# Patient Record
Sex: Male | Born: 1945 | Race: White | Hispanic: No | Marital: Married | Smoking: Never smoker
Health system: Southern US, Community
[De-identification: ages and names within clinical notes are randomized; demographics above are authoritative.]

## PROBLEM LIST (undated history)

## (undated) DIAGNOSIS — I209 Angina pectoris, unspecified: Secondary | ICD-10-CM

## (undated) DIAGNOSIS — Q2112 Patent foramen ovale: Secondary | ICD-10-CM

## (undated) DIAGNOSIS — G4733 Obstructive sleep apnea (adult) (pediatric): Secondary | ICD-10-CM

## (undated) DIAGNOSIS — G459 Transient cerebral ischemic attack, unspecified: Secondary | ICD-10-CM

## (undated) DIAGNOSIS — I10 Essential (primary) hypertension: Secondary | ICD-10-CM

## (undated) DIAGNOSIS — M5136 Other intervertebral disc degeneration, lumbar region: Secondary | ICD-10-CM

## (undated) DIAGNOSIS — E785 Hyperlipidemia, unspecified: Secondary | ICD-10-CM

## (undated) DIAGNOSIS — N529 Male erectile dysfunction, unspecified: Secondary | ICD-10-CM

## (undated) DIAGNOSIS — M5126 Other intervertebral disc displacement, lumbar region: Secondary | ICD-10-CM

## (undated) DIAGNOSIS — R0600 Dyspnea, unspecified: Secondary | ICD-10-CM

## (undated) DIAGNOSIS — M5410 Radiculopathy, site unspecified: Secondary | ICD-10-CM

## (undated) DIAGNOSIS — Q211 Atrial septal defect, unspecified: Secondary | ICD-10-CM

## (undated) DIAGNOSIS — M1990 Unspecified osteoarthritis, unspecified site: Secondary | ICD-10-CM

## (undated) DIAGNOSIS — I4891 Unspecified atrial fibrillation: Secondary | ICD-10-CM

## (undated) HISTORY — DX: Atrial septal defect, unspecified: Q21.10

## (undated) HISTORY — DX: Atrial septal defect: Q21.1

## (undated) HISTORY — DX: Radiculopathy, site unspecified: M54.10

## (undated) HISTORY — PX: TONSILLECTOMY: SUR1361

## (undated) HISTORY — DX: Angina pectoris, unspecified: I20.9

## (undated) HISTORY — DX: Obstructive sleep apnea (adult) (pediatric): G47.33

## (undated) HISTORY — DX: Transient cerebral ischemic attack, unspecified: G45.9

## (undated) HISTORY — DX: Other intervertebral disc displacement, lumbar region: M51.26

## (undated) HISTORY — DX: Essential (primary) hypertension: I10

## (undated) HISTORY — DX: Hyperlipidemia, unspecified: E78.5

## (undated) HISTORY — DX: Patent foramen ovale: Q21.12

## (undated) HISTORY — DX: Other intervertebral disc degeneration, lumbar region: M51.36

## (undated) HISTORY — DX: Dyspnea, unspecified: R06.00

## (undated) HISTORY — DX: Male erectile dysfunction, unspecified: N52.9

## (undated) HISTORY — DX: Unspecified osteoarthritis, unspecified site: M19.90

## (undated) HISTORY — DX: Unspecified atrial fibrillation: I48.91

## (undated) HISTORY — PX: OTHER SURGICAL HISTORY: SHX169

---

## 2013-09-17 ENCOUNTER — Ambulatory Visit: Payer: Self-pay | Admitting: Cardiology

## 2013-09-22 ENCOUNTER — Encounter: Payer: Self-pay | Admitting: Cardiology

## 2013-09-22 ENCOUNTER — Encounter: Payer: Self-pay | Admitting: *Deleted

## 2013-09-22 DIAGNOSIS — I209 Angina pectoris, unspecified: Secondary | ICD-10-CM | POA: Insufficient documentation

## 2013-09-22 DIAGNOSIS — I10 Essential (primary) hypertension: Secondary | ICD-10-CM | POA: Insufficient documentation

## 2013-09-23 ENCOUNTER — Ambulatory Visit (INDEPENDENT_AMBULATORY_CARE_PROVIDER_SITE_OTHER): Payer: BC Managed Care – PPO | Admitting: Cardiology

## 2013-09-23 ENCOUNTER — Encounter: Payer: Self-pay | Admitting: *Deleted

## 2013-09-23 ENCOUNTER — Encounter: Payer: Self-pay | Admitting: Cardiology

## 2013-09-23 VITALS — BP 130/82 | HR 61

## 2013-09-23 DIAGNOSIS — Q211 Atrial septal defect: Secondary | ICD-10-CM | POA: Insufficient documentation

## 2013-09-23 DIAGNOSIS — E785 Hyperlipidemia, unspecified: Secondary | ICD-10-CM | POA: Insufficient documentation

## 2013-09-23 DIAGNOSIS — G459 Transient cerebral ischemic attack, unspecified: Secondary | ICD-10-CM | POA: Insufficient documentation

## 2013-09-23 DIAGNOSIS — R0989 Other specified symptoms and signs involving the circulatory and respiratory systems: Secondary | ICD-10-CM

## 2013-09-23 DIAGNOSIS — Q2112 Patent foramen ovale: Secondary | ICD-10-CM | POA: Insufficient documentation

## 2013-09-23 DIAGNOSIS — I4891 Unspecified atrial fibrillation: Secondary | ICD-10-CM | POA: Insufficient documentation

## 2013-09-23 DIAGNOSIS — R0600 Dyspnea, unspecified: Secondary | ICD-10-CM | POA: Insufficient documentation

## 2013-09-23 DIAGNOSIS — R0609 Other forms of dyspnea: Secondary | ICD-10-CM

## 2013-09-23 DIAGNOSIS — Q2111 Secundum atrial septal defect: Secondary | ICD-10-CM

## 2013-09-23 NOTE — Patient Instructions (Signed)
Your physician wants you to follow-up in: 6 MONTHS WITH DR Jens SomRENSHAW IN [place] You will receive a reminder letter in the mail two months in advance. If you don't receive a letter, please call our office to schedule the follow-up appointment.   Your physician has requested that you have an echocardiogram. Echocardiography is a painless test that uses sound waves to create images of your heart. It provides your doctor with information about the size and shape of your heart and how well your heart's chambers and valves are working. This procedure takes approximately one hour. There are no restrictions for this procedure. AT THE [place] OFFICE  PRADAXA OR XARELTO OR ELIQUIS TO REPLACE WARFARIN

## 2013-09-23 NOTE — Assessment & Plan Note (Signed)
Patient with history of atrial fibrillation.he remains in sinus rhythm. Continue beta blocker and propafenone. Long discussion concerning anticoagulation. Embolic risk factors of previous TIA, hypertension and age greater than 8165. I have recommended NOAC. 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 I have explained the higher risk of stroke with aspirin compared to these agents. He will contact us if he would like to begin NOAC in the future.

## 2013-09-23 NOTE — Assessment & Plan Note (Signed)
Blood pressure controlled. Continue present medications. 

## 2013-09-23 NOTE — Assessment & Plan Note (Signed)
Status post closure. 

## 2013-09-23 NOTE — Addendum Note (Signed)
Addended by: Kem ParkinsonBARNES, Naliyah Neth on: 09/23/2013 03:24 PM   Modules accepted: Level of Service, SmartSet

## 2013-09-23 NOTE — Assessment & Plan Note (Signed)
Schedule echocardiogram to assess LV function. 

## 2013-09-23 NOTE — Progress Notes (Signed)
HPI: 68 year old male for evaluation of atrial fibrillation. Previous care at [facility]. He has a history of atrial flutter ablation in October of 2011. He had recurrent atrial fibrillation following the procedure improved with propafenone. There is a history of TIAs. He also has a history of PFO closure with Amplatzer device. Followup transesophageal echocardiogram showed no residual shunt. Negative stress test in January of 2013. Patient recently moved to this area and presents to establish. He has noted some dyspnea on exertion but no orthopnea, PND, pedal edema, chest pain, palpitations or syncope.  Current Outpatient Prescriptions  Medication Sig Dispense Refill  . aspirin 81 MG tablet Take 81 mg by mouth daily.      Marland Kitchen atorvastatin (LIPITOR) 20 MG tablet Take 1 tablet by mouth daily.      . folic acid (FOLVITE) 1 MG tablet Take 1 tablet by mouth daily.      Marland Kitchen lisinopril (PRINIVIL,ZESTRIL) 20 MG tablet Take 1 tablet by mouth daily.      . metoprolol succinate (TOPROL-XL) 25 MG 24 hr tablet Take 25 mg by mouth daily.      . Omega 3 1000 MG CAPS Take 1 capsule by mouth daily.      . propafenone (RYTHMOL SR) 325 MG 12 hr capsule Take 1 capsule by mouth daily.      . vitamin C (ASCORBIC ACID) 500 MG tablet Take 500 mg by mouth daily.       No current facility-administered medications for this visit.    Not on File  Past Medical History  Diagnosis Date  . Hyperlipidemia   . HTN (hypertension)   . PFO (patent foramen ovale)     S/P closure  . TIA (transient ischemic attack)   . Atrial flutter   . Atrial fibrillation     Past Surgical History  Procedure Laterality Date  . Arthroscopic knee surgery    . Tonsillectomy      History   Social History  . Marital Status: Married    Spouse Name: N/A    Number of Children: 2  . Years of Education: N/A   Occupational History  .      Control and instrumentation engineer   Social History Main Topics  . Smoking status: Never  Smoker   . Smokeless tobacco: Not on file  . Alcohol Use: Yes     Comment: 2 glasses wine per night  . Drug Use: Not on file  . Sexual Activity: Not on file   Other Topics Concern  . Not on file   Social History Narrative  . No narrative on file    Family History  Problem Relation Age of Onset  . Stroke Father   . Heart disease Father     Pacemaker, MI  . Diabetes Sister     ROS: no fevers or chills, productive cough, hemoptysis, dysphasia, odynophagia, melena, hematochezia, dysuria, hematuria, rash, seizure activity, orthopnea, PND, pedal edema, claudication. Remaining systems are negative.  Physical Exam:   Blood pressure 130/82, pulse 61.  General:  Well developed/well nourished in NAD Skin warm/dry Patient not depressed No peripheral clubbing Back-normal HEENT-normal/normal eyelids Neck supple/normal carotid upstroke bilaterally; no bruits; no JVD; no thyromegaly chest - CTA/ normal expansion CV - RRR/normal S1 and S2; no murmurs, rubs or gallops;  PMI nondisplaced Abdomen -NT/ND, no HSM, no mass, + bowel sounds, no bruit 2+ femoral pulses, no bruits Ext-no edema, chords, 2+ DP Neuro-grossly nonfocal  ECG Sinus rhythm, first degree  AV block; no ST changes.

## 2013-09-23 NOTE — Progress Notes (Signed)
  This encounter was created in error - please disregard.  THIS ABSTRACTION IS A ERRONEOUS ENCOUNTER PLEASE DISREGARD PLEASE SEE OTHER ABSTRACTION FOR INFO

## 2013-09-24 ENCOUNTER — Telehealth: Payer: Self-pay | Admitting: Cardiology

## 2013-09-24 MED ORDER — PROPAFENONE HCL ER 325 MG PO CP12: 325.0000 mg | ORAL_CAPSULE | Freq: Two times a day (BID) | ORAL | Status: DC

## 2013-09-24 MED ORDER — FOLIC ACID 1 MG PO TABS: 1.0000 mg | ORAL_TABLET | Freq: Two times a day (BID) | ORAL | Status: DC

## 2013-09-24 NOTE — Telephone Encounter (Signed)
Pt stated his meds were entered wrong. Adjustments were made to rythmol and folic acid.

## 2013-09-24 NOTE — Telephone Encounter (Signed)
New problem   Pt has questions about his medications. He stating it is wrong. Please call pt

## 2013-10-14 ENCOUNTER — Other Ambulatory Visit (HOSPITAL_COMMUNITY): Payer: Medicare Other

## 2013-10-15 ENCOUNTER — Encounter: Payer: Self-pay | Admitting: Internal Medicine

## 2013-10-15 ENCOUNTER — Ambulatory Visit (HOSPITAL_COMMUNITY): Payer: BC Managed Care – PPO | Attending: Internal Medicine | Admitting: Radiology

## 2013-10-15 ENCOUNTER — Other Ambulatory Visit: Payer: Self-pay

## 2013-10-15 DIAGNOSIS — Z8673 Personal history of transient ischemic attack (TIA), and cerebral infarction without residual deficits: Secondary | ICD-10-CM | POA: Insufficient documentation

## 2013-10-15 DIAGNOSIS — I4891 Unspecified atrial fibrillation: Secondary | ICD-10-CM | POA: Insufficient documentation

## 2013-10-15 DIAGNOSIS — Z9889 Other specified postprocedural states: Secondary | ICD-10-CM | POA: Insufficient documentation

## 2013-10-15 DIAGNOSIS — I10 Essential (primary) hypertension: Secondary | ICD-10-CM | POA: Insufficient documentation

## 2013-10-15 DIAGNOSIS — R0600 Dyspnea, unspecified: Secondary | ICD-10-CM

## 2013-10-15 DIAGNOSIS — E785 Hyperlipidemia, unspecified: Secondary | ICD-10-CM | POA: Insufficient documentation

## 2013-10-15 DIAGNOSIS — I4892 Unspecified atrial flutter: Secondary | ICD-10-CM | POA: Insufficient documentation

## 2013-10-15 DIAGNOSIS — Q211 Atrial septal defect: Secondary | ICD-10-CM

## 2013-10-15 DIAGNOSIS — Q2112 Patent foramen ovale: Secondary | ICD-10-CM

## 2013-10-15 DIAGNOSIS — R0602 Shortness of breath: Secondary | ICD-10-CM

## 2013-10-15 DIAGNOSIS — I079 Rheumatic tricuspid valve disease, unspecified: Secondary | ICD-10-CM | POA: Insufficient documentation

## 2013-10-15 NOTE — Progress Notes (Signed)
Echocardiogram performed.  

## 2013-10-16 ENCOUNTER — Telehealth: Payer: Self-pay | Admitting: Cardiology

## 2013-10-16 MED ORDER — FOLIC ACID 1 MG PO TABS: 1.0000 mg | ORAL_TABLET | Freq: Two times a day (BID) | ORAL | Status: DC

## 2013-10-16 NOTE — Telephone Encounter (Signed)
New Message ° °Pt called for ECHO results °

## 2013-10-16 NOTE — Telephone Encounter (Signed)
Pt is aware of echo results He also needed a refill on his folic acid. Sent in to [facility] [place] Debbie [facility] & Coray RN

## 2013-11-18 ENCOUNTER — Other Ambulatory Visit: Payer: Self-pay | Admitting: *Deleted

## 2013-11-18 MED ORDER — ATORVASTATIN CALCIUM 20 MG PO TABS: 20.0000 mg | ORAL_TABLET | Freq: Every day | ORAL | Status: DC

## 2013-11-18 MED ORDER — METOPROLOL SUCCINATE ER 25 MG PO TB24: 25.0000 mg | ORAL_TABLET | Freq: Every day | ORAL | Status: DC

## 2013-11-18 MED ORDER — LISINOPRIL 20 MG PO TABS: 20.0000 mg | ORAL_TABLET | Freq: Every day | ORAL | Status: DC

## 2013-11-18 MED ORDER — PROPAFENONE HCL ER 325 MG PO CP12: 325.0000 mg | ORAL_CAPSULE | Freq: Two times a day (BID) | ORAL | Status: DC

## 2014-01-14 ENCOUNTER — Other Ambulatory Visit: Payer: Self-pay

## 2014-01-14 MED ORDER — PROPAFENONE HCL ER 325 MG PO CP12: 325.0000 mg | ORAL_CAPSULE | Freq: Two times a day (BID) | ORAL | Status: DC

## 2014-06-02 ENCOUNTER — Encounter: Payer: Self-pay | Admitting: Cardiology

## 2014-06-02 ENCOUNTER — Ambulatory Visit (INDEPENDENT_AMBULATORY_CARE_PROVIDER_SITE_OTHER): Payer: BC Managed Care – PPO | Admitting: Cardiology

## 2014-06-02 VITALS — BP 122/80 | HR 83 | Ht 68.0 in | Wt 215.1 lb

## 2014-06-02 DIAGNOSIS — I480 Paroxysmal atrial fibrillation: Secondary | ICD-10-CM

## 2014-06-02 DIAGNOSIS — G458 Other transient cerebral ischemic attacks and related syndromes: Secondary | ICD-10-CM

## 2014-06-02 DIAGNOSIS — E785 Hyperlipidemia, unspecified: Secondary | ICD-10-CM

## 2014-06-02 DIAGNOSIS — I4891 Unspecified atrial fibrillation: Secondary | ICD-10-CM

## 2014-06-02 DIAGNOSIS — I10 Essential (primary) hypertension: Secondary | ICD-10-CM

## 2014-06-02 NOTE — Progress Notes (Signed)
HPI: FU atrial fibrillation. Previous care at [facility]. He has a history of atrial flutter ablation in October of 2011. He had recurrent atrial fibrillation following the procedure improved with propafenone. 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 He also has a history of PFO closure with Amplatzer device. Followup transesophageal echocardiogram showed no residual shunt. Negative stress test in January of 2013. Echocardiogram repeated in February 2015. This revealed normal LV function, grade 1 diastolic dysfunction, trace aortic insufficiency. There was mild tricuspid regurgitation.Since last seen, the patient has dyspnea with more extreme activities but not with routine activities. It is relieved with rest. It is not associated with chest pain. There is no orthopnea, PND or pedal edema. There is no syncope or palpitations. There is no exertional chest pain.    Current Outpatient Prescriptions  Medication Sig Dispense Refill  . aspirin 81 MG tablet Take 81 mg by mouth daily.      Marland Kitchen atorvastatin (LIPITOR) 20 MG tablet Take 1 tablet (20 mg total) by mouth daily.  90 tablet  3  . folic acid (FOLVITE) 1 MG tablet Take 1 tablet (1 mg total) by mouth 2 (two) times daily.  180 tablet  3  . lisinopril (PRINIVIL,ZESTRIL) 20 MG tablet Take 1 tablet (20 mg total) by mouth daily.  90 tablet  4  . metoprolol succinate (TOPROL-XL) 25 MG 24 hr tablet Take 1 tablet (25 mg total) by mouth daily.  90 tablet  4  . Omega 3 1000 MG CAPS Take 1 capsule by mouth 2 (two) times daily.       . propafenone (RYTHMOL SR) 325 MG 12 hr capsule Take 1 capsule (325 mg total) by mouth 2 (two) times daily.  180 capsule  2  . vitamin C (ASCORBIC ACID) 500 MG tablet Take 500 mg by mouth daily.       No current facility-administered medications for this visit.     Past Medical History  Diagnosis Date  . Hyperlipidemia   . HTN (hypertension)   . Angina pectoris   . Atrial  fibrillation   . Patent foramen ovale   . TIA (transient ischemic attack)   . Dyspnea     Past Surgical History  Procedure Laterality Date  . Arthroscopic knee surgery    . Tonsillectomy      History   Social History  . Marital Status: Married    Spouse Name: N/A    Number of Children: 2  . Years of Education: N/A   Occupational History  .      Control and instrumentation engineer   Social History Main Topics  . Smoking status: Never Smoker   . Smokeless tobacco: Not on file  . Alcohol Use: Yes     Comment: 2 glasses wine per night  . Drug Use: Not on file  . Sexual Activity: Not on file   Other Topics Concern  . Not on file   Social History Narrative  . No narrative on file    ROS: no fevers or chills, productive cough, hemoptysis, dysphasia, odynophagia, melena, hematochezia, dysuria, hematuria, rash, seizure activity, orthopnea, PND, pedal edema, claudication. Remaining systems are negative.  Physical Exam: Well-developed well-nourished in no acute distress.  Skin is warm and dry.  HEENT is normal.  Neck is supple.  Chest is clear to auscultation with normal expansion.  Cardiovascular exam is irregular Abdominal exam nontender or distended. No masses palpated. Extremities show no edema.  neuro grossly intact  ECG Probable course atrial fibrillation with no ST changes.

## 2014-06-02 NOTE — Patient Instructions (Signed)
Your physician wants you to follow-up in: 3 MONTHS WITH DR Jens SomRENSHAW You will receive a reminder letter in the mail two months in advance. If you don't receive a letter, please call our office to schedule the follow-up appointment.  REFERRAL TO DR Johney FrameALLRED TO DISCUSS ATRIAL FIB ABLATION.

## 2014-06-02 NOTE — Assessment & Plan Note (Signed)
Continue statin. 

## 2014-06-02 NOTE — Assessment & Plan Note (Signed)
The patient is in atrial fibrillation today. He is unaware of this and is relatively asymptomatic. He has mild dyspnea on exertion but no different compared to when I saw him previously in sinus rhythm. His rate is controlled. Continue beta blocker. Embolic risk factors of age greater than 5965, hypertension and previous TIA. I strongly recommended discontinuation of aspirin and initiation of NOAC. I explained the higher risk of stroke associated with atrial fibrillation not anticoagulated. He declined anticoagulation at this point and understands the risk. He is an avid Best boyracquetball player. He is concerned about bleeding associated with trauma while playing racquetball. He has asked to be considered for ablation. I will schedule an appointment with Dr. Johney FrameAllred. If ablation was successful he may be at lower risk of an embolic event in the future off of anticoagulation. However I explained that given history of TIA Dr. Johney FrameAllred in may never be comfortable discontinuing anticoagulation. We will arrange for evaluation and continue propafenone for now.

## 2014-06-02 NOTE — Assessment & Plan Note (Signed)
Previous TIA approximately 6 years ago felt secondary to patent foramen ovale. This was closed.

## 2014-06-02 NOTE — Assessment & Plan Note (Signed)
Blood pressure controlled. Continue present medications. 

## 2014-07-19 ENCOUNTER — Ambulatory Visit (INDEPENDENT_AMBULATORY_CARE_PROVIDER_SITE_OTHER): Payer: BC Managed Care – PPO | Admitting: Internal Medicine

## 2014-07-19 ENCOUNTER — Encounter: Payer: Self-pay | Admitting: Internal Medicine

## 2014-07-19 VITALS — BP 130/82 | HR 86 | Ht 68.0 in | Wt 214.8 lb

## 2014-07-19 DIAGNOSIS — R0683 Snoring: Secondary | ICD-10-CM

## 2014-07-19 DIAGNOSIS — Q2112 Patent foramen ovale: Secondary | ICD-10-CM

## 2014-07-19 DIAGNOSIS — I10 Essential (primary) hypertension: Secondary | ICD-10-CM

## 2014-07-19 DIAGNOSIS — Q211 Atrial septal defect: Secondary | ICD-10-CM

## 2014-07-19 DIAGNOSIS — I4819 Other persistent atrial fibrillation: Secondary | ICD-10-CM

## 2014-07-19 DIAGNOSIS — I481 Persistent atrial fibrillation: Secondary | ICD-10-CM

## 2014-07-19 NOTE — Progress Notes (Signed)
Primary Care Physician: Dennis BastABEZA,YURI, MD Primary Cardiologist:Dr. Jens Somrenshaw Primary Electrophysiologist:Dr. Sukari Grist Referring Physician:dr. Melba CoonCrenshaw   James Cobb is a 68 y.o. male with a h/o persistent atrial fibrillation who presents for consultation in the [facility].  The patient was initially [place] after presenting with symptoms of irregular heart beat. He had an ablation and developed some afib after the procedure but had returnted to sinus rhythm at time of scheduled DCCV. He remained on propafenone for years maintaining NSR but afib has become persistent since the last 6 weeks,found at routine Cardiology f/u from which pt is asymptomatic. He has relocated to this area x one year.  He also has history of PFO with closure with Amplatzer device, [place] years ago,[place], having 2-3 TIA's in one day which prompted workup. 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 He is asymptomatic, very high energy level and feels great. He would not be a ablation candidate to the prior PFO closure and concerns ability to cross the atrial  septum.  He does drink 2 glasses of wine nightly and has a snoring history, no previous sleep study.  Today, he denies symptoms of palpitations, chest pain, shortness of breath, orthopnea, PND, lower extremity edema, dizziness, presyncope, syncope, snoring, daytime somnolence, bleeding, or neurologic sequela. The patient is tolerating medications without difficulties and is otherwise without complaint today.    Atrial Fibrillation Risk Factors:  he does have symptoms or diagnosis of sleep apnea.  he does not have a history of rheumatic fever.  he does have a history of alcohol use.  he has a BMI of Body mass index is 32.67  kg/(m^2).Marland Kitchen. Filed Weights   07/19/14 1123  Weight: 214 lb 12.8 oz (97.433 kg)    LA size: 38mm   Atrial Fibrillation Management history:  Previous antiarrhythmic drugs: propafenone  Previous cardioversions: none  Previous ablations: x1 for aflutter  CHADS2VASC score: at least 4  Anticoagulation history: none   Past Medical History  Diagnosis Date  . Hyperlipidemia   . HTN (hypertension)   . Angina pectoris   . Atrial fibrillation   . Patent foramen ovale   . TIA (transient ischemic attack)     s/p ASD Percutaneous Closure  . Dyspnea   . ASD (atrial septal defect)   . 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 ED (erectile dysfunction)   . Radiculopathy   . Bulging lumbar disc    Past Surgical History  Procedure Laterality Date  . Arthroscopic knee surgery    . Tonsillectomy      Current Outpatient Prescriptions  Medication Sig Dispense Refill  . aspirin 81 MG tablet Take 81 mg by mouth daily.    Marland Kitchen. atorvastatin (LIPITOR) 20 MG tablet Take 1 tablet (20 mg total) by mouth daily. 90 tablet 3  . folic acid (FOLVITE) 1 MG tablet Take 1 tablet (1 mg total) by mouth 2 (two) times daily. 180 tablet 3  . lisinopril (PRINIVIL,ZESTRIL) 20 MG tablet Take 1 tablet (20 mg total) by mouth daily. 90 tablet 4  . metoprolol succinate (TOPROL-XL) 25 MG 24 hr tablet Take 1 tablet (25 mg total) by  mouth daily. 90 tablet 4  . Omega 3 1000 MG CAPS Take 1 capsule by mouth 2 (two) times daily.     . vitamin C (ASCORBIC ACID) 500 MG tablet Take 500 mg by mouth daily.     No current facility-administered medications for this visit.    No Known Allergies  History   Social History  . Marital Status: Married    Spouse Name: N/A    Number of Children: 2  . 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 Smoking status: Never Smoker    . Smokeless tobacco: Not on file  . Alcohol Use: Yes     Comment: 2 glasses wine per night  . Drug Use: Not on file  . Sexual Activity: Not on file   Other Topics Concern  . Not on file   Social History Narrative    Family History  Problem Relation Age of Onset  . Stroke Father   . Heart disease Father     Pacemaker, MI  . Diabetes Sister    The patient does not have a history of early familial atrial fibrillation or other arrhythmias.  ROS- All systems are reviewed and negative except as per the HPI above.  Physical Exam: Filed Vitals:   07/19/14 1123  BP: 130/82  Pulse: 86  Height: 5\' 8"  (1.727 m)  Weight: 214 lb 12.8 oz (97.433 kg)    GEN- The patient is well appearing, alert and oriented x 3 today.   Head- normocephalic, atraumatic Eyes-  Sclera clear, conjunctiva pink Ears- hearing intact Oropharynx- clear Neck- supple, no JVP Lymph- no cervical lymphadenopathy Lungs- Clear to ausculation bilaterally, normal work of breathing Heart- Iregular rate and rhythm, no murmurs, rubs or gallops, PMI not laterally displaced GI- soft, NT, ND, + BS Extremities- no clubbing, cyanosis, or edema MS- no significant deformity or atrophy Skin- no rash or lesion Psych- euthymic mood, full affect Neuro- strength and sensation are intact  EKG Afib, 86 bpm QRS 82 ms,QTc 411 ms Echo - Procedure narrative: Transthoracic echocardiography for left ventricular function evaluation. Image quality was suboptimal. - Left ventricle: The cavity size was normal. There was moderate concentric hypertrophy. Systolic function was normal. The estimated ejection fraction was in the range of 60% to 65%. Wall motion was normal; there were no regional wall motion abnormalities. Doppler parameters are consistent with abnormal left ventricular relaxation (grade 1 diastolic dysfunction). The E/e' ratio is <10, suggesting normal LV filling pressure. - Mitral valve: Mildly  thickened leaflets . Trivial regurgitation. - Right ventricle: The cavity size was normal. Wall thickness was normal. The moderator band was prominent. Systolic function was normal. RV systolic pressure: 23mm Hg (S, est). - Atrial septum: A patent foramen ovale cannot be excluded. - Tricuspid valve: Mild regurgitation. - Inferior vena cava: The vessel was normal in size; the respirophasic diameter changes were in the normal range (= 50%); findings are consistent with normal central venous pressure. - Pericardium, extracardiac: There was no pericardial effusion.  Epic records are reviewed at length today  Assessment and Plan:  1. Atrial fibrillation The patient has Asymptomatic persistent atrial fibrillation.  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 The patient is adequately rate controlled without rate control drugs.. Antiarrhythmic therapy to dates  has included propafenone which currently is not maintaining SR.  This patients CHA2DS2-VASc Score and unadjusted Ischemic Stroke Rate (% per year) is equal to 4.8 % stroke rate/year from a score of 4  Above score calculated as 1 point each if present [CHF, HTN, DM, Vascular=MI/PAD/Aortic Plaque, Age if 65-74, or Male] Above score calculated as 2 points each if present [Age > 75, or Stroke/TIA/TE]  The patients left atrial size is 38 mm.  Additional echo findings include normal EF A long discussion with the patient was had today regarding therapeutic strategies.  Extensive discussion of lifestyle modification including decrease or avoid alcohol intake was also discussed. He snores and therefore sleep apnea should be assessed.  Today we discussed AVERROES which suggests significant reduction in stroke risks with eliquis when compared to ASA.  He will discuss with FAA and then  initiate eliquis if this will not interfere with flying.  I have informed him that there is no data to suggest that ablation is an adequate alternative to anticoagulation.  The only indication for ablation is for symptomatic relief with AF.  As he is asymptomatic at this time, I do not think that there is presently a role for ablation in this patient. We did discuss LAA occlusion devices (Watchman) as a possibility in the future, however given amplatz occlusion of a prior PFO, this may not be technically feasible.  Presently, our recommendations include : Stop rhythmol (given ineffectiveness) and continue rate control long term Stop ASA and start eliquis (pt will continue to contemplate) Cut back to 2 ETOH drinks a week. Sleep study ordered.  Follow up with Dr. Jens Somrenshaw in 3 months I will see as needed going forward.

## 2014-07-19 NOTE — Progress Notes (Signed)
Primary Care Physician: Dennis BastABEZA,YURI, MD Primary Cardiologist:Dr. Jens Somrenshaw Primary Electrophysiologist:Dr. [name] Referring Physician:dr. Melba CoonCrenshaw   James Cobb is a 68 y.o. male with a h/o persistent atrial fibrillation who presents for consultation in the [facility].  The patient was initially diagnosed with atrial flutter [place] the [place] 2011 after presenting with symptoms of irregular heart beat. He had an ablation and developed some afib after the procedure but had returnted to sinus rhythm at time of scheduled DCCV. He remained on propafenone for years maintaining NSR but afib has become persistent since the last 6 weeks,found at routine Cardiology f/u from which pt is asymptomatic. He has relocated to this area x one year.  He also has history of PFO with closure with Amplatzer device, [place] ago,[place], having 2-3 TIA's in one day which prompted workup. 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 He is asymptomatic, very high energy level and feels great. He would not be a ablation candidate to the prior PFO closure and concerns ability to cross the atrial  septum.  He does drink 2 glasses of wine nightly and has a snoring history, no previous sleep study.  Today, he denies symptoms of palpitations, chest pain, shortness of breath, orthopnea, PND, lower extremity edema, dizziness, presyncope, syncope, snoring, daytime somnolence, bleeding, or neurologic sequela. The patient is tolerating medications without difficulties and is otherwise without complaint today.    Atrial Fibrillation Risk Factors:  he does have symptoms or diagnosis of sleep apnea.  he does not have a history of rheumatic fever.  he does have a history of alcohol use.  he has a BMI of Body mass index is 32.67  kg/(m^2).Marland Kitchen. Filed Weights   07/19/14 1123  Weight: 214 lb 12.8 oz (97.433 kg)    LA size: 38mm   Atrial Fibrillation Management history:  Previous antiarrhythmic drugs: propafenone  Previous cardioversions: none  Previous ablations: x1 for aflutter  CHADS2VASC score: at least 4  Anticoagulation history: none   Past Medical History  Diagnosis Date  . Hyperlipidemia   . HTN (hypertension)   . Angina pectoris   . Atrial fibrillation   . Patent foramen ovale   . TIA (transient ischemic attack)     s/p ASD Percutaneous Closure  . Dyspnea   . ASD (atrial septal defect)   . 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 ED (erectile dysfunction)   . Radiculopathy   . Bulging lumbar disc    Past Surgical History  Procedure Laterality Date  . Arthroscopic knee surgery    . Tonsillectomy      Current Outpatient Prescriptions  Medication Sig Dispense Refill  . aspirin 81 MG tablet Take 81 mg by mouth daily.    Marland Kitchen. atorvastatin (LIPITOR) 20 MG tablet Take 1 tablet (20 mg total) by mouth daily. 90 tablet 3  . folic acid (FOLVITE) 1 MG tablet Take 1 tablet (1 mg total) by mouth 2 (two) times daily. 180 tablet 3  . lisinopril (PRINIVIL,ZESTRIL) 20 MG tablet Take 1 tablet (20 mg total) by mouth daily. 90 tablet 4  . metoprolol succinate (TOPROL-XL) 25 MG 24 hr tablet Take 1 tablet (25 mg total) by  mouth daily. 90 tablet 4  . Omega 3 1000 MG CAPS Take 1 capsule by mouth 2 (two) times daily.     . vitamin C (ASCORBIC ACID) 500 MG tablet Take 500 mg by mouth daily.     No current facility-administered medications for this visit.    No Known Allergies  History   Social History  . Marital Status: Married    Spouse Name: N/A    Number of Children: 2  . 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 Smoking status: Never Smoker    . Smokeless tobacco: Not on file  . Alcohol Use: Yes     Comment: 2 glasses wine per night  . Drug Use: Not on file  . Sexual Activity: Not on file   Other Topics Concern  . Not on file   Social History Narrative    Family History  Problem Relation Age of Onset  . Stroke Father   . Heart disease Father     Pacemaker, MI  . Diabetes Sister    The patient does not have a history of early familial atrial fibrillation or other arrhythmias.  ROS- All systems are reviewed and negative except as per the HPI above.  Physical Exam: Filed Vitals:   07/19/14 1123  BP: 130/82  Pulse: 86  Height: 5\' 8"  (1.727 m)  Weight: 214 lb 12.8 oz (97.433 kg)    GEN- The patient is well appearing, alert and oriented x 3 today.   Head- normocephalic, atraumatic Eyes-  Sclera clear, conjunctiva pink Ears- hearing intact Oropharynx- clear Neck- supple, no JVP Lymph- no cervical lymphadenopathy Lungs- Clear to ausculation bilaterally, normal work of breathing Heart- Iregular rate and rhythm, no murmurs, rubs or gallops, PMI not laterally displaced GI- soft, NT, ND, + BS Extremities- no clubbing, cyanosis, or edema MS- no significant deformity or atrophy Skin- no rash or lesion Psych- euthymic mood, full affect Neuro- strength and sensation are intact  EKG Afib, 86 bpm QRS 82 ms,QTc 411 ms Echo - Procedure narrative: Transthoracic echocardiography for left ventricular function evaluation. Image quality was suboptimal. - Left ventricle: The cavity size was normal. There was moderate concentric hypertrophy. Systolic function was normal. The estimated ejection fraction was in the range of 60% to 65%. Wall motion was normal; there were no regional wall motion abnormalities. Doppler parameters are consistent with abnormal left ventricular relaxation (grade 1 diastolic dysfunction). The E/e' ratio is <10, suggesting normal LV filling pressure. - Mitral valve: Mildly  thickened leaflets . Trivial regurgitation. - Right ventricle: The cavity size was normal. Wall thickness was normal. The moderator band was prominent. Systolic function was normal. RV systolic pressure: 23mm Hg (S, est). - Atrial septum: A patent foramen ovale cannot be excluded. - Tricuspid valve: Mild regurgitation. - Inferior vena cava: The vessel was normal in size; the respirophasic diameter changes were in the normal range (= 50%); findings are consistent with normal central venous pressure. - Pericardium, extracardiac: There was no pericardial effusion.  Epic records are reviewed at length today  Assessment and Plan:  1. Atrial fibrillation The patient has Asymptomatic persistent atrial fibrillation.  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 The patient is adequately rate controlled without rate control drugs.. Antiarrhythmic therapy to dates  has included propafenone which currently is not maintaining SR.  The patients left atrial size is 38 mm.  Additional echo findings include normal EF A long discussion with the patient was had today regarding therapeutic strategies.  Extensive discussion of lifestyle modification including decrease or avoid alcohol intake was also discussed.  Presently, our recommendations include : 1.Persistent afib- The pt is asymptomatic, exercise tolerance is excellent and ablation may not be possible due to prior PFO closure.  AAD therapy was discussed but would not make pt feel any better and may feel worse due to side effects. Propafenone not  maintaining SR so will discontinue and continue rate control. Chadsvasc score of at least 4(age,htn, 2 for TIA) Pt is still reluctant to start NOAC, Eliquis 5 mg bid recommended. He will check with Aviation MD and if allowed, will start taking. If so,  stop ASA and curtail vigorous racquetball activities until can see how he will tolerate.  2.  Alcohol Use Cut back to 2 drinks a week.  3. Obstructive sleep apnea The importance of adequate treatment of sleep apnea was discussed today in order to improve our ability to maintain sinus rhythm long term.Sleep study ordered.  4. Follow up with Dr. Jens Somrenshaw in 3 months, here as needed.     Rudi Cocoonna Delphina Schum NP  Nurse Practitioner, [facility] 07/19/2014 5:07 PM

## 2014-07-19 NOTE — Patient Instructions (Signed)
Your physician recommends that you schedule a follow-up appointment in: 3 months with Dr. Jens Somrenshaw  Your physician has recommended that you have a sleep study. This test records several body functions during sleep, including: brain activity, eye movement, oxygen and carbon dioxide blood levels, heart rate and rhythm, breathing rate and rhythm, the flow of air through your mouth and nose, snoring, body muscle movements, and chest and belly movement.  Your physician has recommended you make the following change in your medication:  1) STOP Propafenone  Call Dennis BastKelly Lanier, RN back once you find out about the Eliquis  Decrease Alcohol consumption

## 2014-07-20 ENCOUNTER — Telehealth: Payer: Self-pay | Admitting: Internal Medicine

## 2014-07-20 MED ORDER — APIXABAN 5 MG PO TABS: 5.0000 mg | ORAL_TABLET | Freq: Two times a day (BID) | ORAL | Status: DC

## 2014-07-20 NOTE — Telephone Encounter (Signed)
Follow up   ° ° °Patient calling back to speak with nurse regarding medication   °

## 2014-07-20 NOTE — Telephone Encounter (Signed)
Spoke with patient patient and he is going to stop Aspirin and start Eliquis 5mg  twice daily today.

## 2014-09-14 ENCOUNTER — Telehealth: Payer: Self-pay | Admitting: Cardiology

## 2014-09-14 ENCOUNTER — Other Ambulatory Visit: Payer: Self-pay | Admitting: *Deleted

## 2014-09-14 MED ORDER — APIXABAN 5 MG PO TABS: 5.0000 mg | ORAL_TABLET | Freq: Two times a day (BID) | ORAL | Status: DC

## 2014-09-14 NOTE — Telephone Encounter (Signed)
Please asap,concerning his medicine. Pt did not give me details,he said it was critical.

## 2014-09-14 NOTE — Telephone Encounter (Signed)
Spoke with pt, he was given allopurinol and colchicine for elevated uric acid levels and was told to take eliquis 2.5 mg bid. He has done this x one month and his uric acid level is still high. They have increase the allopurinol and were very vague regarding how he should take the eliquis.

## 2014-09-14 NOTE — Telephone Encounter (Signed)
Discussed with sally earl pharm md, instructed to take the eliquis 5 mg bid. There is no need to reduce the dosage with the current medicines. Copy of dr allred's office note from 07-19-14 mailed to patient per his request.

## 2014-10-06 ENCOUNTER — Ambulatory Visit (HOSPITAL_BASED_OUTPATIENT_CLINIC_OR_DEPARTMENT_OTHER): Payer: BC Managed Care – PPO | Attending: Internal Medicine | Admitting: Radiology

## 2014-10-06 VITALS — Ht 68.0 in | Wt 200.0 lb

## 2014-10-06 DIAGNOSIS — G4719 Other hypersomnia: Secondary | ICD-10-CM

## 2014-10-06 DIAGNOSIS — G4710 Hypersomnia, unspecified: Secondary | ICD-10-CM

## 2014-10-06 DIAGNOSIS — I4819 Other persistent atrial fibrillation: Secondary | ICD-10-CM

## 2014-10-06 DIAGNOSIS — I481 Persistent atrial fibrillation: Secondary | ICD-10-CM | POA: Diagnosis not present

## 2014-10-12 ENCOUNTER — Telehealth: Payer: Self-pay | Admitting: Cardiology

## 2014-10-12 NOTE — Sleep Study (Signed)
   NAME: James Cobb DATE OF BIRTH:  11/01/1945 MEDICAL RECORD NUMBER [id number]  LOCATION: [facility]  PHYSICIAN: TURNER,[name]  DATE OF STUDY: 10/06/2014  SLEEP STUDY TYPE: Nocturnal Polysomnogram               REFERRING PHYSICIAN: Hillis RangeAllred, James, MD  INDICATION FOR STUDY: snoring, excessive daytime sleepiness  EPWORTH SLEEPINESS SCORE: 8 HEIGHT: 5\' 8"  (172.7 cm)  WEIGHT: 200 lb (90.719 kg)    Body mass index is 30.42 kg/(m^2).  NECK SIZE: 16.5 in.  MEDICATIONS: Reviewed in the chart  SLEEP ARCHITECTURE: The patient slept for a total of 249 minutes with no slow wave sleep and 30 minutes of REM sleep.  Onset to sleep latency was prolonged at 40 minutes and onset to REM sleep latency was slightly prolonged at 112 minutes.  Sleep efficiency was reduced at 59%.    RESPIRATORY DATA: There were a total of 9 apneas, of which, 4 were obstructive and 5 were central.  There were 59 obstructive hypopneas.  Most events occurred in the non supine position and during NREM sleep.  The overall AHI is 16.4 events per hour consistent with moderate Obstructive sleep apnea/hypopnea syndrome.  There was mild to moderate snoring.  OXYGEN DATA: The lowest O2 saturation during REM sleep was 80% and during NREM sleep 85%.  The total time spent with O2 saturations <88% was 4 minutes.    CARDIAC DATA: The patient maintained atrial fibrillation with PVC's.  Average HR was 99bpm with max HR 184bpm and minimum HR 35bpm.  MOVEMENT/PARASOMNIA: There were an insignificant amount of periodic limb movements noted for a PLMS index of 1.9 movements per hour.  There were no REM behavior sleep disorders noted.    IMPRESSION/ RECOMMENDATION:   1.  Moderate obstructive sleep apnea/hypopnea syndrome with an AHI of 16 events per hour.   Most events occurred during NREM sleep in the non supine position.   2.  Reduced sleep efficiency with increased frequency of arousals due to respiratory events. 3.   Oxygen desaturations associated with respiratory events with lowest O2 saturation of 80%.   4.  Reduced REM sleep. 5.  Atrial fibrillation with PVC's.  Average HR was 97bpm .  Maximum HR 184bpm and minimum HR 35bpm.   6.  The patient should be counseled in good sleep hygiene and weight loss.   7.  Give degree of sleep disordered breathing and patient's symptoms of excessive daytime sleepiness and atrial fibrillation, recommend proceeding with CPAP titration.  Signed: [name],[name] Diplomate, American Board of Sleep Medicine  ELECTRONICALLY SIGNED ON:  10/12/2014, 3:36 PM [facility] PH: [phone]   FX: [phone] ACCREDITED BY THE AMERICAN ACADEMY OF SLEEP MEDICINE

## 2014-10-12 NOTE — Telephone Encounter (Signed)
Please let patient know that he has moderate OSA and I recommend proceeding with CPAP titration.  Please set up for CPAP titration.

## 2014-10-13 NOTE — Telephone Encounter (Signed)
Left message to call back  

## 2014-10-15 NOTE — Telephone Encounter (Signed)
Patient refuses CPAP titration at this time.  Patient requesting copy of PSG study and Dr. Norris Crossurner's report. Informed patient study has not been uploaded, but both will be sent to him when available. Patient agrees with treatment plan.

## 2014-10-20 ENCOUNTER — Telehealth: Payer: Self-pay | Admitting: Cardiology

## 2014-10-20 DIAGNOSIS — I480 Paroxysmal atrial fibrillation: Secondary | ICD-10-CM

## 2014-10-20 NOTE — Telephone Encounter (Signed)
Dr Johney FrameAllred ordered this sleep study; I will forward to him to discuss with pt. I will schedule fu for atrial fibrillation as appears rate control will be therapy. Olga MillersBrian Greenley Martone

## 2014-10-20 NOTE — Telephone Encounter (Signed)
Patient asking about Sleep Study report. Informed him that it was placed in the "outgoing bin" on Monday.   Patient agitated stating he has not heard from Dr. Johney FrameAllred or Dr. Jens Somrenshaw about sleep study results.  Reminded him that Dr. Mayford Knifeurner read the sleep studiy, and he was already given results (see last phone encounter).  Pt wants to hear from [name] or Crenshaw about why he needed the sleep study in the first place and what they are going to do now. He st his heart rate is almost 100 bpm and his cardiac issues need to be addressed.   To Dr. Johney FrameAllred and Jens Somrenshaw and their nurses for follow-up.

## 2014-10-20 NOTE — Telephone Encounter (Signed)
The patient has moderate sleep apnea and it appears that Dr Mayford Knifeurner plans on CPAP trial.  Tresa EndoKelly, please inform the patient that he should be hearing from Dr Mayford Knifeurner, our sleep specialist soon.

## 2014-10-20 NOTE — Telephone Encounter (Signed)
Please let patient know that he has moderate OSA and I recommend proceeding with CPAP titration.   Please set up for CPAP titration.           Looks like patient ws informed on 10/12/14 by Dr Norris Crossurner's nurse

## 2014-10-20 NOTE — Telephone Encounter (Signed)
New Msg        Pt returning call about sleep study.     Please return call.

## 2014-10-21 NOTE — Telephone Encounter (Signed)
Is patient willing to proceed with CPAP titration - if so please let Orpha BurKaty know so she can order it.

## 2014-10-21 NOTE — Telephone Encounter (Signed)
Spoke with patient and let him know Dr Johney FrameAllred recommended that he proceed with CPAP titration.  He wants Dr Ludwig Clarksrenshaw's opinion as he is his Cardiologist.  He is most concerned with his HR of 100 and wants to know what to do about it.  I let him know I would talk to [facility] Mathis,RN Dr. Ludwig Clarksrenshaw's nurse and have them call him back.  He was very appreciative of my call but wants to hear from Dr Jens Somrenshaw. I tried to explain as Dr Johney FrameAllred ordered the test that is why we called him with the results he wants to her from his cardiologist.

## 2014-10-21 NOTE — Telephone Encounter (Signed)
Agree with CPAP titration; increase toprol to 50 mg daily; in one week 24 hr holter to assess heart rate with atrial fibrillation; then fuov James MillersBrian Crenshaw

## 2014-10-21 NOTE — Telephone Encounter (Signed)
Left message for pt to call.

## 2014-10-22 ENCOUNTER — Encounter: Payer: Self-pay | Admitting: Cardiology

## 2014-10-22 MED ORDER — ALLOPURINOL 100 MG PO TABS: 200.0000 mg | ORAL_TABLET | Freq: Every day | ORAL | Status: AC

## 2014-10-22 MED ORDER — METOPROLOL SUCCINATE ER 50 MG PO TB24: 50.0000 mg | ORAL_TABLET | Freq: Every day | ORAL | Status: DC

## 2014-10-22 NOTE — Telephone Encounter (Signed)
Spoke with pt, Aware of dr Ludwig Clarkscrenshaw's recommendations.  He agrees to the CPAP titration and I will have dr turner's nurse give him a call. Patient voiced understanding to increase the toprol, new script sent to the pharmacy. [facility] [address] location. Patient has a follow up appointment.

## 2014-10-22 NOTE — Telephone Encounter (Signed)
Returning call to Verm[facility]ra . Please call   Thanks

## 2014-10-22 NOTE — Telephone Encounter (Signed)
This encounter was created in error - please disregard.

## 2014-10-26 ENCOUNTER — Other Ambulatory Visit: Payer: Self-pay | Admitting: *Deleted

## 2014-10-26 ENCOUNTER — Telehealth: Payer: Self-pay

## 2014-10-26 DIAGNOSIS — I480 Paroxysmal atrial fibrillation: Secondary | ICD-10-CM

## 2014-10-26 DIAGNOSIS — G4730 Sleep apnea, unspecified: Secondary | ICD-10-CM

## 2014-10-26 NOTE — Telephone Encounter (Signed)
Left message to call back  

## 2014-10-26 NOTE — Telephone Encounter (Signed)
-----   Message from Deliah Goodyebra Mathis, RN sent at 10/22/2014  5:24 PM EST ----- Patient has agreed to the CPAP titration. Can you please call and discuss with him. Thanks.

## 2014-10-27 ENCOUNTER — Other Ambulatory Visit: Payer: Self-pay | Admitting: *Deleted

## 2014-10-27 MED ORDER — FOLIC ACID 1 MG PO TABS: 1.0000 mg | ORAL_TABLET | Freq: Two times a day (BID) | ORAL | Status: DC

## 2014-10-27 NOTE — Telephone Encounter (Signed)
Spoke with pt. About setting up the order for the CPAP titration. He is still okay with doing this, so I let him know that I would put in the order and someone would be calling him to schedule this. He stated verbal understanding. The order has been submitted and [facility].

## 2014-10-27 NOTE — Telephone Encounter (Signed)
Called to speak with pt to let him know I will be putting in order for CPAP Titration. His wife asked that I call him back at 11:00am

## 2014-10-27 NOTE — Telephone Encounter (Signed)
Pt was called by Mariane Masterseborah Miller to set up CPAP Titration and he told her that he did not know when he was going back to work. She tried to schedule this for May, and he said that he didn't know a date that he could do it. He told her that He did not want to do anything at the moment, that when he decided he wanted to do this he would call back to schedule it.  We have left the order in so that when he calls he can just schedule the titration.

## 2014-10-29 ENCOUNTER — Other Ambulatory Visit: Payer: Self-pay | Admitting: *Deleted

## 2014-10-29 MED ORDER — ATORVASTATIN CALCIUM 20 MG PO TABS: 20.0000 mg | ORAL_TABLET | Freq: Every day | ORAL | Status: DC

## 2014-11-03 ENCOUNTER — Ambulatory Visit: Payer: Medicare Other | Admitting: Cardiology

## 2014-11-10 ENCOUNTER — Ambulatory Visit (INDEPENDENT_AMBULATORY_CARE_PROVIDER_SITE_OTHER): Payer: BC Managed Care – PPO | Admitting: Cardiology

## 2014-11-10 ENCOUNTER — Encounter: Payer: Self-pay | Admitting: Cardiology

## 2014-11-10 VITALS — BP 116/78 | HR 100 | Ht 68.0 in | Wt 213.4 lb

## 2014-11-10 DIAGNOSIS — E785 Hyperlipidemia, unspecified: Secondary | ICD-10-CM

## 2014-11-10 DIAGNOSIS — Q211 Atrial septal defect: Secondary | ICD-10-CM

## 2014-11-10 DIAGNOSIS — I10 Essential (primary) hypertension: Secondary | ICD-10-CM | POA: Diagnosis not present

## 2014-11-10 DIAGNOSIS — G4733 Obstructive sleep apnea (adult) (pediatric): Secondary | ICD-10-CM

## 2014-11-10 DIAGNOSIS — I482 Chronic atrial fibrillation, unspecified: Secondary | ICD-10-CM

## 2014-11-10 DIAGNOSIS — Q2112 Patent foramen ovale: Secondary | ICD-10-CM

## 2014-11-10 NOTE — Assessment & Plan Note (Signed)
Continue statin. 

## 2014-11-10 NOTE — Patient Instructions (Signed)
Your physician wants you to follow-up in: 6 MONTHS WITH DR CRENSHAW You will receive a reminder letter in the mail two months in advance. If you don't receive a letter, please call our office to schedule the follow-up appointment.   Your physician recommends that you HAVE LAB WORK TODAY 

## 2014-11-10 NOTE — Assessment & Plan Note (Addendum)
Discussed the importance of CPAP. Patient will need titration. This is being managed by Dr. Mayford Knifeurner.

## 2014-11-10 NOTE — Assessment & Plan Note (Signed)
Status post closure. 

## 2014-11-10 NOTE — Progress Notes (Signed)
HPI: FU atrial fibrillation. Previous care at [facility]. He has a history of atrial flutter ablation in October of 2011. He had recurrent atrial fibrillation following the procedure. 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 He also has a history of PFO closure with Amplatzer device. Followup transesophageal echocardiogram showed no residual shunt. Negative stress test in January of 2013. Echocardiogram repeated in February 2015. This revealed normal LV function, grade 1 diastolic dysfunction, trace aortic insufficiency. There was mild tricuspid regurgitation. Patient seen by Dr. Johney Frame and rate control/anticoagulation recommended. Rythmol discontinued. Patient had a sleep study that showed moderate obstructive sleep apnea. History of Toprol was increased for improved rate control recently. Monitor ordered to make sure her rate adequately controlled but not performed. Since last seen, the patient denies any dyspnea on exertion, orthopnea, PND, pedal edema, palpitations, syncope or chest pain.   Current Outpatient Prescriptions  Medication Sig Dispense Refill  . allopurinol (ZYLOPRIM) 100 MG tablet Take 2 tablets (200 mg total) by mouth daily.    Marland Kitchen apixaban (ELIQUIS) 5 MG TABS tablet Take 1 tablet (5 mg total) by mouth 2 (two) times daily. 180 tablet 3  . atorvastatin (LIPITOR) 20 MG tablet Take 1 tablet (20 mg total) by mouth daily. 90 tablet 0  . folic acid (FOLVITE) 1 MG tablet Take 1 tablet (1 mg total) by mouth 2 (two) times daily. 180 tablet 0  . lisinopril (PRINIVIL,ZESTRIL) 20 MG tablet Take 1 tablet (20 mg total) by mouth daily. 90 tablet 4  . metoprolol succinate (TOPROL-XL) 50 MG 24 hr tablet Take 1 tablet (50 mg total) by mouth daily. 90 tablet 3   No current facility-administered medications for this visit.     Past Medical History  Diagnosis Date  . Hyperlipidemia   . HTN (hypertension)   . Angina pectoris   . Atrial  fibrillation   . Patent foramen ovale   . TIA (transient ischemic attack)     s/p ASD Percutaneous Closure  . Dyspnea   . ASD (atrial septal defect)   . 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 ED (erectile dysfunction)   . Radiculopathy   . Bulging lumbar disc     Past Surgical History  Procedure Laterality Date  . Arthroscopic knee surgery    . Tonsillectomy      History   Social History  . Marital Status: Married    Spouse Name: N/A  . Number of Children: 2  . Years of Education: N/A   Occupational History  .      Control and instrumentation engineer   Social History Main Topics  . Smoking status: Never Smoker   . Smokeless tobacco: Not on file  . Alcohol Use: Yes     Comment: 2 glasses wine per night  . Drug Use: Not on file  . Sexual Activity: Not on file   Other Topics Concern  . Not on file   Social History Narrative    ROS: no fevers or chills, productive cough, hemoptysis, dysphasia, odynophagia, melena, hematochezia, dysuria, hematuria, rash, seizure activity, orthopnea, PND, pedal edema, claudication. Remaining systems are negative.  Physical Exam: Well-developed well-nourished in no acute distress.  Skin is warm and dry.  HEENT is normal.  Neck is supple.  Chest is clear to auscultation with normal expansion.  Cardiovascular exam is irregular Abdominal exam nontender  or distended. No masses palpated. Extremities show no edema. neuro grossly intact

## 2014-11-10 NOTE — Assessment & Plan Note (Addendum)
Patient is now in permanent atrial fibrillation. Continue Toprol for rate control. CHADSvasc 4. Continue apixaban. Check hemoglobin and renal function. Check 24-hour Holter monitor to make sure that rate is controlled. 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

## 2014-11-10 NOTE — Assessment & Plan Note (Signed)
Blood pressure controlled. Continue present medications. 

## 2014-11-11 ENCOUNTER — Encounter: Payer: Self-pay | Admitting: Cardiology

## 2014-11-11 ENCOUNTER — Telehealth: Payer: Self-pay | Admitting: Cardiology

## 2014-11-11 LAB — CBC
HEMATOCRIT: 46.5 % (ref 39.0–52.0)
Hemoglobin: 15.5 g/dL (ref 13.0–17.0)
MCH: 32.4 pg (ref 26.0–34.0)
MCHC: 33.3 g/dL (ref 30.0–36.0)
MCV: 97.3 fL (ref 78.0–100.0)
MPV: 11.1 fL (ref 8.6–12.4)
PLATELETS: 264 10*3/uL (ref 150–400)
RBC: 4.78 MIL/uL (ref 4.22–5.81)
RDW: 14 % (ref 11.5–15.5)
WBC: 6 10*3/uL (ref 4.0–10.5)

## 2014-11-11 LAB — BASIC METABOLIC PANEL WITH GFR
BUN: 19 mg/dL (ref 6–23)
CHLORIDE: 105 meq/L (ref 96–112)
CO2: 30 meq/L (ref 19–32)
Calcium: 9.9 mg/dL (ref 8.4–10.5)
Creat: 1.02 mg/dL (ref 0.50–1.35)
GFR, EST AFRICAN AMERICAN: 87 mL/min
GFR, EST NON AFRICAN AMERICAN: 75 mL/min
Glucose, Bld: 91 mg/dL (ref 70–99)
Potassium: 4.4 mEq/L (ref 3.5–5.3)
Sodium: 141 mEq/L (ref 135–145)

## 2014-11-11 NOTE — Telephone Encounter (Signed)
New message         Pt would like more information about the cpap titration

## 2014-11-11 NOTE — Telephone Encounter (Signed)
Patient informed that CPAP titration will be done overnight at the sleep lab. Patient unhappy and does not understand why he has to go to the lab again. Explained the different between the sleep study and the CPAP titration and patient reluctantly agrees to keep appointment. Instructed patient to call if he has any further questions.

## 2014-11-23 ENCOUNTER — Ambulatory Visit (HOSPITAL_BASED_OUTPATIENT_CLINIC_OR_DEPARTMENT_OTHER): Payer: BC Managed Care – PPO | Attending: Cardiology | Admitting: Radiology

## 2014-11-23 VITALS — Ht 68.0 in | Wt 205.0 lb

## 2014-11-23 DIAGNOSIS — G4733 Obstructive sleep apnea (adult) (pediatric): Secondary | ICD-10-CM | POA: Diagnosis not present

## 2014-11-23 DIAGNOSIS — I4891 Unspecified atrial fibrillation: Secondary | ICD-10-CM | POA: Diagnosis not present

## 2014-11-23 DIAGNOSIS — G4710 Hypersomnia, unspecified: Secondary | ICD-10-CM | POA: Diagnosis not present

## 2014-11-23 DIAGNOSIS — G4730 Sleep apnea, unspecified: Secondary | ICD-10-CM

## 2014-11-23 DIAGNOSIS — R0683 Snoring: Secondary | ICD-10-CM | POA: Diagnosis not present

## 2014-11-24 ENCOUNTER — Encounter: Payer: Self-pay | Admitting: Cardiology

## 2014-11-24 ENCOUNTER — Telehealth: Payer: Self-pay | Admitting: Cardiology

## 2014-11-24 NOTE — Telephone Encounter (Signed)
This encounter was created in error - please disregard.

## 2014-11-24 NOTE — Telephone Encounter (Signed)
Informed patient of results and verbal understanding expressed.  OV scheduled for 6/10. [facility] notified.  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  Patient supplied his email address ([email or web address]) to give to medical records to send him a release.

## 2014-11-24 NOTE — Addendum Note (Signed)
Addended by: Armanda MagicURNER, Karlon Schlafer R on: 11/24/2014 02:19 PM   Modules accepted: Orders

## 2014-11-24 NOTE — Telephone Encounter (Signed)
Please let patient know that he had successful CPAP titration and set up OV with me in 10 weeks.  [facility]

## 2014-11-24 NOTE — Telephone Encounter (Signed)
New message      Pt had  CPAP titration.  He want the results for his job.  Please call

## 2014-11-24 NOTE — Sleep Study (Signed)
   NAME: James CrossRene Smith DATE OF BIRTH:  02/27/1946 MEDICAL RECORD NUMBER [id number]  LOCATION: [facility]  PHYSICIAN: TURNER,[name]  DATE OF STUDY: 11/23/2014  SLEEP STUDY TYPE: Positive Airway Pressure Titration               REFERRING PHYSICIAN: [name], [name], MD  INDICATION FOR STUDY: Obstructive Sleep Apnea  EPWORTH SLEEPINESS SCORE: 6 HEIGHT: 5\' 8"  (172.7 cm)  WEIGHT: 205 lb (92.987 kg)    Body mass index is 31.18 kg/(m^2).  NECK SIZE: 17 in.  MEDICATIONS: Reviewed in the chart  SLEEP ARCHITECTURE: The patient slept for a total of 214 minutes out of a total sleep period of 258 minutes.  There was no slow wave sleep and 56 minutes of REM sleep.  The onset to sleep latency was normal at 10 minutes and onset to REM sleep latency was normal at 84 minutes.  The sleep efficiency was reduced at 60%.    RESPIRATORY DATA: The patient was started on CPAP at 5cm H2O and titrated for respiratory events and snoring to 16cm H2O.  The patient was able to achieve REM sleep at optimum pressure of 16cm H2O and maintain that pressure for a prolonged period of time without any further respiratory events.  The AHI was 0.  Snoring was eliminated on CPAP.  OXYGEN DATA: The average oxygen saturation during the CPAP titration was 96%.  The lowest oxygen saturation was 91%.    CARDIAC DATA: The patient was in atrial fibrillation throughout the study.  The average heart rate was 89bpm,  The maximum heart rate was 171bpm and the minimum heart rate was 38bpm.    MOVEMENT/PARASOMNIA: There were an increased number of periodic limb movement disorders with a PLMS index of 19.3 movements per hour.  There were no REM sleep behavior disorders.    IMPRESSION/ RECOMMENDATION:   1.   Moderate obstructive sleep apnea/hypopnea syndrome with an AHI of 16 events per hour.   2.   Successful CPAP titration to 16cm H2O.  The patient was able to achieve REM sleep at optimum pressure of 16cm H2O and  maintain that pressure for a prolonged period of time without any further respiratory events.  The AHI was 0.  Snoring was eliminated on CPAP. 3.  Reduced sleep efficiency with increased frequency of spontaneous arousals.   4.  Atrial fibrillation noted during the study with average heart rate 89 bpm.   5.  Recommend ResMed CPAP with heated humidifier at 16cm H2O, statndard Resmed Mirage FX mask.  Signed: [name],[name] Diplomate, American Board of Sleep Medicine  ELECTRONICALLY SIGNED ON:  11/24/2014, 2:07 PM [facility] PH: [phone]   FX: [phone] ACCREDITED BY THE AMERICAN ACADEMY OF SLEEP MEDICINE

## 2014-11-29 ENCOUNTER — Telehealth: Payer: Self-pay | Admitting: Cardiology

## 2014-11-29 NOTE — Telephone Encounter (Signed)
He did well on this setting at the sleep lab and I would like him to try it first and see how he does

## 2014-11-29 NOTE — Telephone Encounter (Signed)
Calling stating he picked up his CPAP machine this AM.  They advised him that the setting was on "16".  He states this is too high and he knows he can't sleep with that high of a setting. States someone else was at the facility when he was there and their machine was set on automatic.  Advised would have to forward to Dr. Mayford Knifeurner to see what she advises.  Will notify him when we have a response from her.

## 2014-11-29 NOTE — Telephone Encounter (Signed)
New Message  Pt called requests a call back to discuss the pressure on his sleep apnea device/ Please call

## 2014-11-29 NOTE — Telephone Encounter (Signed)
States he will try this setting tonight but is not happy with keeping it on "16".  States he will try for tonight but knows it won't work so will be calling back tomorrow.  He states he would like the automatic setting.  Advised to try and let us know how he sleeps.

## 2014-11-29 NOTE — Telephone Encounter (Signed)
Lm to cb.

## 2014-11-30 ENCOUNTER — Telehealth: Payer: Self-pay | Admitting: Cardiology

## 2014-11-30 DIAGNOSIS — G4733 Obstructive sleep apnea (adult) (pediatric): Secondary | ICD-10-CM

## 2014-11-30 NOTE — Telephone Encounter (Signed)
Pt is frustrated. States he told the tech that conducted the sleep study that the pressure was too high later into the study which was 16/ he states he did not sleep after that.  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  He wants either to switch the machine for an automatic or reduce the settings. Pt will not wear the machine tonight.  Please advise.

## 2014-11-30 NOTE — Telephone Encounter (Signed)
New problem   Pt stated he called yesterday concerning his CPAP machine and he stated the information the nurse gave him to do didn't work last night. Pt is requesting another call back and stated for someone to please contact Dr Mayford Knifeurner for advise

## 2014-11-30 NOTE — Telephone Encounter (Signed)
I will forward to Dr Mayford Knifeurner/ and CMA that will be here tomorrow.

## 2014-12-01 ENCOUNTER — Telehealth: Payer: Self-pay | Admitting: *Deleted

## 2014-12-01 NOTE — Telephone Encounter (Signed)
I discussed with Mr. James Cobb the notes below.  I let him know that [facility] him as[facility] to set up this titration. He did not seem satisfied with anything below and wanted an exact time that he would be [facility]. He was very argumentative about everything that he was told, and said that if [facility] him early in the morning that he would not be answering because he is on a different schedule than most, and that they could talk with him on his time. He stated that he will not be wearing his mask tonight, and that if this titration is urgent then [facility] should have [facility] him today when he said he was available.  He stated that he did not like surprises and wanted to know the exact times of phone calls and what they would be telling him. I told him that I could not give him that 100% assurance because all I could do was put the order in and mark it as urgent so they would call him. He was again not satisfied with this, immediately told me goodbye and hung up.

## 2014-12-01 NOTE — Telephone Encounter (Signed)
Pt very frustrated regarding talking to Dr. Mayford Knifeurner, has been trying for 3 days and was told yesterday she would call him this am and has not gotten a call--he is requesting a call back today

## 2014-12-01 NOTE — Telephone Encounter (Signed)
Please order a 2 week auto CPAP titration from 4 to 20cm H2O through [facility] - please call Henderson NewcomerMelissa Stenson [phone] with [facility]

## 2014-12-01 NOTE — Telephone Encounter (Signed)
Spoke with Mr. James Cobb about Dr Mayford Knifeurner ordering a 2 week titration. At first he stated that he was okay with this, but then wanted me to ask Dr. Mayford Knifeurner about the automatic machine.    Per Dr. Mayford Knifeurner, the automatic machine would not work for him because of the amount of apneas that he had during his sleep study. She stated that if his oxygen level drops or if his breathing pauses, the automatic machine has to "catch up" so the amount of pressure may never be the same during the night. At some points it may be too high, and at others it might not be enough.   I have placed the order for the 2 week titration, and contacted Henderson NewcomerMelissa Stenson [facility] to let her know the order is in.     I will call Mr. James Cobb to let him know all the details above. He has requested a call back today, but only after 3pm.

## 2014-12-01 NOTE — Telephone Encounter (Signed)
Erroneous Encounter. See phone note below

## 2014-12-21 ENCOUNTER — Telehealth: Payer: Self-pay | Admitting: Cardiology

## 2014-12-21 DIAGNOSIS — G4733 Obstructive sleep apnea (adult) (pediatric): Secondary | ICD-10-CM

## 2014-12-21 NOTE — Telephone Encounter (Signed)
Per Dr. Mayford Knifeurner, patient is to decrease CPAP to 12 cm H2O and check download in two weeks.  Left message to call back.   [facility] notified and settings changed.

## 2014-12-21 NOTE — Telephone Encounter (Signed)
New Message       Pt calling stating that someone was supposed to call him to give him guidance on using C-pap machine, pt states if he doesn't get any guidance he will not be using the Cpap- machine tonight. Please call back and advise.

## 2014-12-21 NOTE — Telephone Encounter (Signed)
See instructions on CPAP download from DME

## 2014-12-21 NOTE — Telephone Encounter (Signed)
Patient irritated he has not heard back from the office about his two week auto-titration.  Patient st the 2 weeks was completed today and he has not returned his machine. Informed the patient that we have not received the results from [facility] yet because he still has the machine, but that they would be contacted to send. Patient upset that we "don't have our act together" and that "we should know his results now." Reiterated to patient that [facility] will have to send the results to us for Dr. Mayford Knifeurner to review and I would be in contact with him after. Pt continued to get more frustrated, demanding he is called back "before long" because he refuses to take the machine back if we don't call him with instructions. He also refuses to pay over $900 for the second machine (the charge if he does not bring it back).  Apologized to patient for his frustrations and told him [facility].   Patient requesting for Dr. Mayford Knifeurner to prescribe him the auto-titrating PAP so this will not be an issue any longer.  [facility] and requested auto-titration results. Received and given to Dr. Mayford Knifeurner for review.

## 2014-12-22 NOTE — Telephone Encounter (Signed)
Follow Up  Pt states that he was on an automatic machine for a study. The results would have shown which pressure to use and if it has been applied. He states that he cannot use the machine until it has been adjusted.

## 2014-12-22 NOTE — Telephone Encounter (Signed)
Pt made aware that [facility] him regarding the new machine titration. Pt stated he has already gotten a voicemail and that he returned the other machine back today.  Pt requested that once all reports are resulted by Dr. 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   Message  below from [facility]t to pt CPAP: I have spoken with Mardelle MatteAndy today. He is changing the pt's pressure to 12 today.  We have instructed him to turn in the loaner unit as well.  Thanks!

## 2014-12-23 NOTE — Telephone Encounter (Signed)
Left message for patient that I personally spoke with [facility] Tuesday and told them about the pressure change. Instructed patient to call back if there are any further questions or concerns.

## 2014-12-23 NOTE — Telephone Encounter (Signed)
Left message to call back  

## 2014-12-24 NOTE — Telephone Encounter (Signed)
Patient requesting download information. Printed and placed in out-going mail. Patient expresses gratitude for all the help.

## 2014-12-28 ENCOUNTER — Telehealth: Payer: Self-pay | Admitting: Cardiology

## 2014-12-28 NOTE — Telephone Encounter (Signed)
Explained to patient that his results were placed in the mail Friday afternoon, so it may not have been picked up until yesterday. Patient upset the office does not have outgoing mail on Saturdays.  Informed patient that he should get his paperwork soon.

## 2014-12-28 NOTE — Telephone Encounter (Signed)
New Message  Pt calling about documents being sent to him. Pt has not received anything. Please call back and discuss.

## 2015-01-02 ENCOUNTER — Encounter (HOSPITAL_BASED_OUTPATIENT_CLINIC_OR_DEPARTMENT_OTHER): Payer: BC Managed Care – PPO

## 2015-01-07 ENCOUNTER — Other Ambulatory Visit: Payer: Self-pay

## 2015-01-07 MED ORDER — ATORVASTATIN CALCIUM 20 MG PO TABS: 20.0000 mg | ORAL_TABLET | Freq: Every day | ORAL | Status: DC

## 2015-01-07 MED ORDER — FOLIC ACID 1 MG PO TABS: 1.0000 mg | ORAL_TABLET | Freq: Two times a day (BID) | ORAL | Status: DC

## 2015-01-07 MED ORDER — LISINOPRIL 20 MG PO TABS: 20.0000 mg | ORAL_TABLET | Freq: Every day | ORAL | Status: DC

## 2015-01-20 ENCOUNTER — Encounter: Payer: Self-pay | Admitting: Cardiology

## 2015-02-01 ENCOUNTER — Telehealth: Payer: Self-pay

## 2015-02-01 NOTE — Telephone Encounter (Signed)
SPOKE WITH PT ABOUT FAMILY STATUS. PT STATED THAT HE DOES NOPT WISH TO DISCUSS PERSONAL INFO OVER THE PHONE.

## 2015-02-02 DIAGNOSIS — E669 Obesity, unspecified: Secondary | ICD-10-CM | POA: Insufficient documentation

## 2015-02-02 NOTE — Progress Notes (Signed)
Cardiology Office Note   Date:  02/03/2015   ID:  James Cobb, DOB 09/22/1945, MRN [id number]  PCP:  Dennis BastABEZA,YURI, MD    Chief Complaint  Patient presents with  . Follow-up    OSA      History of Present Illness: James Cobb is a 69 y.o. male who presents for evaluation of obstructive sleep apnea.  He is followed by Dr. 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 He was recently seen by Dr. Johney FrameAllred and a sleep study was ordered.  He says that his wife has complained about his snoring for years.  He denies being told he would stop breathing in his sleep but has awakened gasping for air and choking in the past.  Prior to CPAP therapy he would fell fatigued but his work would require him to work 14 hour days for 7 days at a time and travel a lot.   Sleep study showed moderate obstructive sleep apnea with an AHI of 16 events per hour.  His epworth sleepiness scale was 8.  His lowest oxygen saturation was 80%.  He underwent CPAP titration to 16cm H2O with an AHI of 0.  The lowest oxygen saturation on CPAP was 91%.  The patient presents back today for followup of his CPAP.  He is doing well with his device.  He tolerates his nasal mask and feels the pressure is adequate.  He has not noticed much change in how he feels when he gest up in the am but has noticed some improvement in daytime sleepiness.  He exercises playing racket ball 4-5 times weekly.      Past Medical History  Diagnosis Date  . Hyperlipidemia   . HTN (hypertension)   . Angina pectoris   . Atrial fibrillation   . Patent foramen ovale   . TIA (transient ischemic attack)     s/p ASD Percutaneous Closure  . Dyspnea   . ASD (atrial septal defect)   . 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 ED (erectile dysfunction)   . Radiculopathy   .  Bulging lumbar disc   . OSA (obstructive sleep apnea)     Moderate with AHI 16/hr and on CPAP at 16cm H2O with nasal mask    Past Surgical History  Procedure Laterality Date  . Arthroscopic knee surgery    . Tonsillectomy       Current Outpatient Prescriptions  Medication Sig Dispense Refill  . allopurinol (ZYLOPRIM) 100 MG tablet Take 2 tablets (200 mg total) by mouth daily.    Marland Kitchen. apixaban (ELIQUIS) 5 MG TABS tablet Take 1 tablet (5 mg total) by mouth 2 (two) times daily. 180 tablet 3  . atorvastatin (LIPITOR) 20 MG tablet Take 1 tablet (20 mg total) by mouth daily. 90 tablet 0  . folic acid (FOLVITE) 1 MG tablet Take 1 tablet (1 mg total) by mouth 2 (two) times daily. 180 tablet 0  . lisinopril (PRINIVIL,ZESTRIL) 20 MG tablet Take 1 tablet (20 mg total) by mouth daily. 90 tablet 02  . metoprolol succinate (TOPROL-XL) 50 MG 24 hr tablet Take 1 tablet (50 mg total) by mouth daily. 90 tablet 3   No current facility-administered medications for this visit.    Allergies:   Review  of patient's allergies indicates no known allergies.    Social History:  The patient  reports that he has never smoked. He does not have any smokeless tobacco history on file. He reports that he drinks alcohol.   Family History:  The patient's family history includes Diabetes in his sister; Heart disease in his father; Stroke in his father.    ROS:  Please see the history of present illness.   Otherwise, review of systems are positive for none.   All other systems are reviewed and negative.    PHYSICAL EXAM: VS:  BP 130/88 mmHg  Pulse 95  Ht  (1.727 m)  Wt 216 lb (97.977 kg)  BMI 32.85 kg/m2  SpO2 98% , BMI Body mass index is 32.85 kg/(m^2). GEN: Well nourished, well developed, in no acute distress HEENT: normal Neck: no JVD, carotid bruits, or masses Cardiac: RRR; no murmurs, rubs, or gallops,no edema  Respiratory:  clear to auscultation bilaterally, normal work of breathing GI: soft,  nontender, nondistended, + BS MS: no deformity or atrophy Skin: warm and dry, no rash Neuro:  Strength and sensation are intact Psych: euthymic mood, full affect   EKG:  EKG is not ordered today.    Recent Labs: 11/10/2014: BUN 19; Creat 1.02; Hemoglobin 15.5; Platelets 264; Potassium 4.4; Sodium 141    Lipid Panel No results found for: CHOL, TRIG, HDL, CHOLHDL, VLDL, LDLCALC, LDLDIRECT    Wt Readings from Last 3 Encounters:  02/03/15 216 lb (97.977 kg)  11/23/14 205 lb (92.987 kg)  11/10/14 213 lb 6.4 oz (96.798 kg)    ASSESSMENT/PLAN: 1.  Moderate OSA on CPAP and tolerating well.   Continue current CPAP settings.  His d/l today showed an AHI of 1.8/hr on 12cm H2O.  He is 97% compliant in using his device > 4 hours nightly.  Patient has been using and benefiting from CPAP use and will continue to benefit from therapy 2.  Obesity - I have encouraged him to continue in his routine exercise program. 3.  HTN - controlled on BB and ACE I.   Current medicines are reviewed at length with the patient today.  The patient does not have concerns regarding medicines.  The following changes have been made:  no change me Labs/ tests ordered today: See above Assessment and Plan No orders of the defined types were placed in this encounter.     Disposition:   FU with 6 months  Signed, Quintella Reichert, MD  02/03/2015 2:52 PM    [facility] [address] [place], [place], [place]  [address] Phone: [phone]; Fax: [phone]

## 2015-02-03 ENCOUNTER — Encounter: Payer: Self-pay | Admitting: Cardiology

## 2015-02-03 ENCOUNTER — Ambulatory Visit (INDEPENDENT_AMBULATORY_CARE_PROVIDER_SITE_OTHER): Payer: BLUE CROSS/BLUE SHIELD | Admitting: Cardiology

## 2015-02-03 VITALS — BP 130/88 | HR 95 | Ht 68.0 in | Wt 216.0 lb

## 2015-02-03 DIAGNOSIS — I10 Essential (primary) hypertension: Secondary | ICD-10-CM | POA: Diagnosis not present

## 2015-02-03 DIAGNOSIS — G4733 Obstructive sleep apnea (adult) (pediatric): Secondary | ICD-10-CM | POA: Insufficient documentation

## 2015-02-03 DIAGNOSIS — E669 Obesity, unspecified: Secondary | ICD-10-CM

## 2015-02-03 NOTE — Patient Instructions (Signed)

## 2015-02-04 ENCOUNTER — Ambulatory Visit: Payer: BC Managed Care – PPO | Admitting: Cardiology

## 2015-02-15 ENCOUNTER — Encounter: Payer: Self-pay | Admitting: Cardiology

## 2015-02-16 ENCOUNTER — Telehealth: Payer: Self-pay | Admitting: *Deleted

## 2015-02-16 NOTE — Telephone Encounter (Signed)
Ok to continue if ok with surgery James Cobb

## 2015-02-16 NOTE — Telephone Encounter (Signed)
Patient is needing carpel tunnel surgery on the left wrist, he was told by the orthopedic surgeon he did not need to stop the eliquis. Patient wants to make sure dr Jens Som agrees. Will forward for dr Jens Som review

## 2015-02-17 NOTE — Telephone Encounter (Signed)
Spoke with pt, Aware of dr crenshaw's recommendations.  °

## 2015-04-03 ENCOUNTER — Other Ambulatory Visit: Payer: Self-pay | Admitting: Cardiology

## 2015-04-06 ENCOUNTER — Other Ambulatory Visit: Payer: Self-pay

## 2015-04-06 MED ORDER — FOLIC ACID 1 MG PO TABS: 1.0000 mg | ORAL_TABLET | Freq: Two times a day (BID) | ORAL | Status: DC

## 2015-04-07 ENCOUNTER — Telehealth: Payer: Self-pay | Admitting: Cardiology

## 2015-04-07 DIAGNOSIS — I4891 Unspecified atrial fibrillation: Secondary | ICD-10-CM

## 2015-04-07 NOTE — Telephone Encounter (Addendum)
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 Orders placed and sent to [facility] for scheduling.

## 2015-04-07 NOTE — Telephone Encounter (Signed)
James Cobb is calling to speak with you James Cobb , States that it is an emergency .Marland Kitchen Please call    Thanks

## 2015-04-13 ENCOUNTER — Telehealth: Payer: Self-pay | Admitting: Cardiology

## 2015-04-13 ENCOUNTER — Telehealth (HOSPITAL_COMMUNITY): Payer: Self-pay

## 2015-04-13 NOTE — Telephone Encounter (Signed)
Patient given detailed instructions per Myocardial Perfusion Study Information Sheet for test on 04-18-2015 at 0730. Patient Notified to arrive 15 minutes early, and that it is imperative to arrive on time for appointment to keep from having the test rescheduled. Patient verbalized understanding. Aundra Espin A   

## 2015-04-13 NOTE — Telephone Encounter (Signed)
Spoke with pt who is aware to ask for [facility] on Monday while he is here having testing done.  Will forward message to her for her knowledge.

## 2015-04-13 NOTE — Telephone Encounter (Signed)
New message     Pt needing report for sleep apnea studies from last couple of months Pt states Toma Copier has been working on getting this report for him Pt wanting to get report on Monday when he comes in for his appointments on Monday

## 2015-04-18 ENCOUNTER — Ambulatory Visit (INDEPENDENT_AMBULATORY_CARE_PROVIDER_SITE_OTHER): Payer: BLUE CROSS/BLUE SHIELD

## 2015-04-18 ENCOUNTER — Encounter: Payer: Self-pay | Admitting: Cardiology

## 2015-04-18 ENCOUNTER — Other Ambulatory Visit (HOSPITAL_COMMUNITY): Payer: BC Managed Care – PPO

## 2015-04-18 ENCOUNTER — Other Ambulatory Visit: Payer: Self-pay

## 2015-04-18 ENCOUNTER — Ambulatory Visit (HOSPITAL_COMMUNITY): Payer: BLUE CROSS/BLUE SHIELD | Attending: Internal Medicine

## 2015-04-18 DIAGNOSIS — I4891 Unspecified atrial fibrillation: Secondary | ICD-10-CM | POA: Insufficient documentation

## 2015-04-18 DIAGNOSIS — E785 Hyperlipidemia, unspecified: Secondary | ICD-10-CM | POA: Diagnosis not present

## 2015-04-18 DIAGNOSIS — I10 Essential (primary) hypertension: Secondary | ICD-10-CM | POA: Diagnosis not present

## 2015-04-18 DIAGNOSIS — E669 Obesity, unspecified: Secondary | ICD-10-CM | POA: Insufficient documentation

## 2015-04-18 DIAGNOSIS — Z6832 Body mass index (BMI) 32.0-32.9, adult: Secondary | ICD-10-CM | POA: Insufficient documentation

## 2015-04-18 DIAGNOSIS — I340 Nonrheumatic mitral (valve) insufficiency: Secondary | ICD-10-CM | POA: Diagnosis not present

## 2015-04-18 LAB — MYOCARDIAL PERFUSION IMAGING
CHL CUP MPHR: 151 {beats}/min
CHL CUP NUCLEAR SRS: 2
CHL CUP RESTING HR STRESS: 92 {beats}/min
CHL RATE OF PERCEIVED EXERTION: 19
CSEPEDS: 0 s
CSEPEW: 10.1 METS
Exercise duration (min): 9 min
LHR: 0.29
LV sys vol: 26 mL
LVDIAVOL: 71 mL
NUC STRESS TID: 0.87
Peak HR: 206 {beats}/min
Percent HR: 136 %
SDS: 2
SSS: 4

## 2015-04-18 MED ORDER — TECHNETIUM TC 99M SESTAMIBI GENERIC - CARDIOLITE
9.2000 | Freq: Once | INTRAVENOUS | Status: AC | PRN
  Administered 2015-04-18: 9 via INTRAVENOUS

## 2015-04-18 MED ORDER — TECHNETIUM TC 99M SESTAMIBI GENERIC - CARDIOLITE
30.6000 | Freq: Once | INTRAVENOUS | Status: AC | PRN
  Administered 2015-04-18: 31 via INTRAVENOUS

## 2015-04-18 NOTE — Telephone Encounter (Signed)
This encounter was created in error - please disregard.

## 2015-04-18 NOTE — Telephone Encounter (Signed)
Please call,said he had some test today at [facility]

## 2015-04-18 NOTE — Telephone Encounter (Signed)
Paperwork has been given to Dr. Mayford Knife to sign of on

## 2015-04-19 NOTE — Telephone Encounter (Signed)
Paperwork has been signed and approved by [employer]. A copy will be scanned into the chart, and a copy has been left at the front desk for patient to pick up.  Patient is aware and thankful for assistance.

## 2015-04-20 ENCOUNTER — Telehealth: Payer: Self-pay | Admitting: Cardiology

## 2015-04-20 NOTE — Telephone Encounter (Signed)
Toniann Fail was calling in wanting to speak with Stanton Kidney about receiving the images from the stress test that the pt and getting the results from the pt's holter monitor as well. Please call  Thanks

## 2015-04-20 NOTE — Telephone Encounter (Signed)
Spoke with wendy, they are needing the patient to have an up to date office visit. Spoke with pt, Follow up scheduled

## 2015-04-22 NOTE — Progress Notes (Signed)
HPI:FU atrial fibrillation. Previous care at [facility]. He has a history of atrial flutter ablation in October of 2011. He had recurrent atrial fibrillation following the procedure. 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 He also has a history of PFO closure with Amplatzer device. Followup transesophageal echocardiogram showed no residual shunt. Patient seen by Dr. Johney Frame and rate control/anticoagulation recommended. Rythmol discontinued. Patient had a sleep study that showed moderate obstructive sleep apnea. Holter monitor August 2016 showed atrial fibrillation with PVCs or aberrantly conducted beats, rate controlled. Nuclear study August 2016 showed ejection fraction 63% and normal perfusion. Echocardiogram August 2016 showed normal LV function, mild left ventricular hypertrophy, mild mitral regurgitation and no residual ASD/PFO. Since last seen the patient denies any dyspnea on exertion, orthopnea, PND, pedal edema, palpitations, syncope or chest pain.   Current Outpatient Prescriptions  Medication Sig Dispense Refill  . allopurinol (ZYLOPRIM) 100 MG tablet Take 2 tablets (200 mg total) by mouth daily.    Marland Kitchen apixaban (ELIQUIS) 5 MG TABS tablet Take 1 tablet (5 mg total) by mouth 2 (two) times daily. 180 tablet 3  . atorvastatin (LIPITOR) 20 MG tablet TAKE 1 TABLET (20 MG TOTAL) BY MOUTH DAILY. 90 tablet 0  . folic acid (FOLVITE) 1 MG tablet Take 1 tablet (1 mg total) by mouth 2 (two) times daily. 180 tablet 0  . lisinopril (PRINIVIL,ZESTRIL) 20 MG tablet Take 1 tablet (20 mg total) by mouth daily. 90 tablet 02  . metoprolol succinate (TOPROL-XL) 50 MG 24 hr tablet Take 1 tablet (50 mg total) by mouth daily. 90 tablet 3   No current facility-administered medications for this visit.     Past Medical History  Diagnosis Date  . Hyperlipidemia   . HTN (hypertension)   . Angina pectoris   . Atrial fibrillation   . Patent foramen ovale   .  TIA (transient ischemic attack)     s/p ASD Percutaneous Closure  . Dyspnea   . ASD (atrial septal defect)   . 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 ED (erectile dysfunction)   . Radiculopathy   . Bulging lumbar disc   . OSA (obstructive sleep apnea)     Moderate with AHI 16/hr and on CPAP at 16cm H2O with nasal mask    Past Surgical History  Procedure Laterality Date  . Arthroscopic knee surgery    . Tonsillectomy      Social History   Social History  . Marital Status: Married    Spouse Name: N/A  . Number of Children: 2  . Years of Education: N/A   Occupational History  .      Control and instrumentation engineer   Social History Main Topics  . Smoking status: Never Smoker   . Smokeless tobacco: Not on file  . Alcohol Use: Yes     Comment: 2 glasses wine per night  . Drug Use: Not on file  . Sexual Activity: Not on file   Other Topics Concern  . Not on file   Social History Narrative    ROS: no fevers or chills, productive cough, hemoptysis, dysphasia, odynophagia, melena, hematochezia, dysuria, hematuria, rash, seizure activity, orthopnea, PND, pedal edema, claudication. Remaining systems are negative.  Physical Exam: Well-developed well-nourished in no acute distress.  Skin is warm and dry.  HEENT is normal.  Neck is supple.  Chest is clear to auscultation with normal expansion.  Cardiovascular exam is irregular Abdominal exam nontender or distended. No masses palpated. Extremities show no edema. neuro grossly intact  ECG atrial fibrillation at a rate of 95. Nonspecific ST changes.

## 2015-04-26 ENCOUNTER — Encounter: Payer: Self-pay | Admitting: Cardiology

## 2015-04-26 ENCOUNTER — Ambulatory Visit (INDEPENDENT_AMBULATORY_CARE_PROVIDER_SITE_OTHER): Payer: BLUE CROSS/BLUE SHIELD | Admitting: Cardiology

## 2015-04-26 VITALS — BP 120/82 | HR 95 | Ht 68.0 in | Wt 220.4 lb

## 2015-04-26 DIAGNOSIS — E785 Hyperlipidemia, unspecified: Secondary | ICD-10-CM

## 2015-04-26 DIAGNOSIS — Q211 Atrial septal defect: Secondary | ICD-10-CM

## 2015-04-26 DIAGNOSIS — I10 Essential (primary) hypertension: Secondary | ICD-10-CM

## 2015-04-26 DIAGNOSIS — I482 Chronic atrial fibrillation, unspecified: Secondary | ICD-10-CM

## 2015-04-26 DIAGNOSIS — G4733 Obstructive sleep apnea (adult) (pediatric): Secondary | ICD-10-CM

## 2015-04-26 DIAGNOSIS — Q2112 Patent foramen ovale: Secondary | ICD-10-CM

## 2015-04-26 NOTE — Patient Instructions (Signed)
Your physician wants you to follow-up in: ONE YEAR WITH DR CRENSHAW You will receive a reminder letter in the mail two months in advance. If you don't receive a letter, please call our office to schedule the follow-up appointment.   Your physician recommends that you return for lab work WHEN FASTING 

## 2015-04-26 NOTE — Assessment & Plan Note (Signed)
Blood pressure controlled. Continue present medications. 

## 2015-04-26 NOTE — Assessment & Plan Note (Signed)
Status post closure. 

## 2015-04-26 NOTE — Assessment & Plan Note (Signed)
Followed by Dr Turner 

## 2015-04-26 NOTE — Assessment & Plan Note (Signed)
Patient remains in permanent atrial fibrillation. Continue beta blocker for rate control. Continue apixaban. Check hemoglobin and renal function.

## 2015-04-26 NOTE — Assessment & Plan Note (Signed)
Continue statin. Check lipids and liver.check TSH.

## 2015-04-27 LAB — CBC
HCT: 46.2 % (ref 39.0–52.0)
HEMOGLOBIN: 16 g/dL (ref 13.0–17.0)
MCH: 33.6 pg (ref 26.0–34.0)
MCHC: 34.6 g/dL (ref 30.0–36.0)
MCV: 97.1 fL (ref 78.0–100.0)
MPV: 10.5 fL (ref 8.6–12.4)
PLATELETS: 215 10*3/uL (ref 150–400)
RBC: 4.76 MIL/uL (ref 4.22–5.81)
RDW: 14.5 % (ref 11.5–15.5)
WBC: 4.8 10*3/uL (ref 4.0–10.5)

## 2015-04-27 LAB — LIPID PANEL
CHOLESTEROL: 117 mg/dL — AB (ref 125–200)
HDL: 36 mg/dL — ABNORMAL LOW (ref >=40)
LDL CALC: 66 mg/dL (ref <130)
TRIGLYCERIDES: 76 mg/dL (ref <150)
Total CHOL/HDL Ratio: 3.3 Ratio (ref <=5.0)
VLDL: 15 mg/dL (ref <30)

## 2015-04-27 LAB — COMPREHENSIVE METABOLIC PANEL
ALBUMIN: 4.5 g/dL (ref 3.6–5.1)
ALK PHOS: 83 U/L (ref 40–115)
ALT: 39 U/L (ref 9–46)
AST: 30 U/L (ref 10–35)
BILIRUBIN TOTAL: 1.5 mg/dL — AB (ref 0.2–1.2)
BUN: 17 mg/dL (ref 7–25)
CO2: 28 mmol/L (ref 20–31)
Calcium: 9.7 mg/dL (ref 8.6–10.3)
Chloride: 103 mmol/L (ref 98–110)
Creat: 0.97 mg/dL (ref 0.70–1.25)
GLUCOSE: 101 mg/dL — AB (ref 65–99)
POTASSIUM: 4.5 mmol/L (ref 3.5–5.3)
Sodium: 140 mmol/L (ref 135–146)
TOTAL PROTEIN: 6.3 g/dL (ref 6.1–8.1)

## 2015-04-27 LAB — TSH: TSH: 1.863 u[IU]/mL (ref 0.350–4.500)

## 2015-06-22 ENCOUNTER — Other Ambulatory Visit: Payer: Self-pay | Admitting: Cardiology

## 2015-06-24 ENCOUNTER — Telehealth: Payer: Self-pay | Admitting: *Deleted

## 2015-06-24 MED ORDER — FOLIC ACID 1 MG PO TABS: 1.0000 mg | ORAL_TABLET | Freq: Two times a day (BID) | ORAL | Status: DC

## 2015-06-24 NOTE — Telephone Encounter (Signed)
Ok to refill James Cobb  

## 2015-06-29 ENCOUNTER — Other Ambulatory Visit: Payer: Self-pay | Admitting: Cardiology

## 2015-06-29 DIAGNOSIS — I480 Paroxysmal atrial fibrillation: Secondary | ICD-10-CM

## 2015-06-29 MED ORDER — LISINOPRIL 20 MG PO TABS: 20.0000 mg | ORAL_TABLET | Freq: Every day | ORAL | Status: DC

## 2015-06-29 MED ORDER — APIXABAN 5 MG PO TABS: 5.0000 mg | ORAL_TABLET | Freq: Two times a day (BID) | ORAL | Status: DC

## 2015-06-29 MED ORDER — METOPROLOL SUCCINATE ER 50 MG PO TB24: 50.0000 mg | ORAL_TABLET | Freq: Every day | ORAL | Status: DC

## 2015-07-04 ENCOUNTER — Telehealth: Payer: Self-pay | Admitting: Cardiology

## 2015-07-04 DIAGNOSIS — I480 Paroxysmal atrial fibrillation: Secondary | ICD-10-CM

## 2015-07-04 MED ORDER — FOLIC ACID 1 MG PO TABS: 1.0000 mg | ORAL_TABLET | Freq: Two times a day (BID) | ORAL | Status: DC

## 2015-07-04 MED ORDER — LISINOPRIL 20 MG PO TABS: 20.0000 mg | ORAL_TABLET | Freq: Every day | ORAL | Status: DC

## 2015-07-04 MED ORDER — ATORVASTATIN CALCIUM 20 MG PO TABS: ORAL_TABLET | ORAL | Status: DC

## 2015-07-04 MED ORDER — APIXABAN 5 MG PO TABS: 5.0000 mg | ORAL_TABLET | Freq: Two times a day (BID) | ORAL | Status: DC

## 2015-07-04 MED ORDER — METOPROLOL SUCCINATE ER 50 MG PO TB24: 50.0000 mg | ORAL_TABLET | Freq: Every day | ORAL | Status: DC

## 2015-07-04 NOTE — Telephone Encounter (Signed)
Pt's refills has been sent to pt's pharmacy, confirmation received

## 2015-07-04 NOTE — Telephone Encounter (Signed)
New message       *STAT* If patient is at the pharmacy, call can be transferred to refill team.   1. Which medications need to be refilled? (please list name of each medication and dose if known) folic acid 1mg , eliquis 5mg , lisinopril 20mg , metoprolol succinate 50mg  and atovastatin 20mg  2. Which pharmacy/location (including street and city if local pharmacy) is [facility] to[facility] 3. Do they need a 30 day or 90 day supply? 90 day

## 2015-08-12 ENCOUNTER — Telehealth: Payer: Self-pay | Admitting: *Deleted

## 2015-08-12 NOTE — Telephone Encounter (Signed)
pt called to confirm  we had sent in his atorvastatin, we did 07/04/15 and he expressed understanding

## 2015-09-06 ENCOUNTER — Encounter: Payer: Self-pay | Admitting: Cardiology

## 2015-09-06 ENCOUNTER — Ambulatory Visit (INDEPENDENT_AMBULATORY_CARE_PROVIDER_SITE_OTHER): Payer: Medicare Other | Admitting: Cardiology

## 2015-09-06 VITALS — BP 140/84 | HR 103 | Ht 68.0 in | Wt 218.6 lb

## 2015-09-06 DIAGNOSIS — E669 Obesity, unspecified: Secondary | ICD-10-CM

## 2015-09-06 DIAGNOSIS — I10 Essential (primary) hypertension: Secondary | ICD-10-CM | POA: Diagnosis not present

## 2015-09-06 DIAGNOSIS — G4733 Obstructive sleep apnea (adult) (pediatric): Secondary | ICD-10-CM | POA: Diagnosis not present

## 2015-09-06 NOTE — Progress Notes (Signed)
Cardiology Office Note   Date:  09/06/2015   ID:  James Cobb, DOB 10/17/1945, MRN [id number]  PCP:  Dennis BastABEZA,YURI, MD    Chief Complaint  Patient presents with  . Sleep Apnea  . Hypertension      History of Present Illness: James Cobb is a 70 y.o. male who presents for followup of obstructive sleep apnea. He is followed by Dr. 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  He is doing well with his device. He tolerates his nasal mask and feels the pressure is adequate. He no longer snores according to his wife.  He has not noticed much change in how he feels when he get up in the am but has noticed some improvement in daytime sleepiness. He exercises playing racket ball 4-5 times weekly.     Past Medical History  Diagnosis Date  . Hyperlipidemia   . HTN (hypertension)   . Angina pectoris (HCC)   . Atrial fibrillation (HCC)   . Patent foramen ovale   . TIA (transient ischemic attack)     s/p ASD Percutaneous Closure  . Dyspnea   . ASD (atrial septal defect)   . 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 ED (erectile dysfunction)   . Radiculopathy   . Bulging lumbar disc   . OSA (obstructive sleep apnea)     Moderate with AHI 16/hr and on CPAP at 16cm H2O with nasal mask    Past Surgical History  Procedure Laterality Date  . Arthroscopic knee surgery    . Tonsillectomy       Current Outpatient Prescriptions  Medication Sig Dispense Refill  . allopurinol (ZYLOPRIM) 100 MG tablet Take 2 tablets (200 mg total) by mouth daily.    Marland Kitchen. apixaban (ELIQUIS) 5 MG TABS tablet Take 1 tablet (5 mg total) by mouth 2 (two) times daily. 180 tablet 2  . atorvastatin (LIPITOR) 20 MG tablet TAKE 1 TABLET (20 MG TOTAL) BY MOUTH DAILY. 90 tablet 2  . folic acid (FOLVITE) 1 MG tablet Take 1 tablet (1 mg  total) by mouth 2 (two) times daily. 180 tablet 2  . lisinopril (PRINIVIL,ZESTRIL) 20 MG tablet Take 1 tablet (20 mg total) by mouth daily. 90 tablet 2  . metoprolol succinate (TOPROL-XL) 50 MG 24 hr tablet Take 1 tablet (50 mg total) by mouth daily. 90 tablet 2   No current facility-administered medications for this visit.    Allergies:   Review of patient's allergies indicates no known allergies.    Social History:  The patient  reports that he has never smoked. He does not have any smokeless tobacco history on file. He reports that he drinks alcohol.   Family History:  The patient's family history includes Diabetes in his sister; Heart disease in his father; Stroke in his father.    ROS:  Please see the history of present illness.   Otherwise, review of systems are positive for none.   All other systems are reviewed and negative.    PHYSICAL EXAM: VS:  BP 140/84 mmHg  Pulse 103  Ht 5\' 8"  (1.727 m)  Wt 218 lb 9.6 oz (99.156 kg)  BMI 33.25 kg/m2  SpO2 98% , BMI Body mass index is 33.25 kg/(m^2). GEN: Well nourished,  well developed, in no acute distress HEENT: normal Neck: no JVD, carotid bruits, or masses Cardiac: irregularly irregular; no murmurs, rubs, or gallops,no edema  Respiratory:  clear to auscultation bilaterally, normal work of breathing GI: soft, nontender, nondistended, + BS MS: no deformity or atrophy Skin: warm and dry, no rash Neuro:  Strength and sensation are intact Psych: euthymic mood, full affect   EKG:  EKG is not ordered today.    Recent Labs: 04/26/2015: ALT 39; BUN 17; Creat 0.97; Hemoglobin 16.0; Platelets 215; Potassium 4.5; Sodium 140; TSH 1.863    Lipid Panel    Component Value Date/Time   CHOL 117* 04/26/2015 1144   TRIG 76 04/26/2015 1144   HDL 36* 04/26/2015 1144   CHOLHDL 3.3 04/26/2015 1144   VLDL 15 04/26/2015 1144   LDLCALC 66 04/26/2015 1144      Wt Readings from Last 3 Encounters:  09/06/15 218 lb 9.6 oz (99.156 kg)    04/26/15 220 lb 6.4 oz (99.973 kg)  04/18/15 216 lb (97.977 kg)     ASSESSMENT/PLAN: 1. Moderate OSA on CPAP and tolerating well. Continue current CPAP settings. His d/l today showed an AHI of 1.1/hr on 12cm H2O. He is 90% compliant in using his device > 4 hours nightly. Patient has been using and benefiting from CPAP use and will continue to benefit from therapy.  He would like to try to get his pressure down so I will get a 2 week autotitration from 4 to 18cm H2O.   2. Obesity - I have encouraged him to continue in his routine exercise program. 3. HTN - controlled on BB and ACE I.    Current medicines are reviewed at length with the patient today.  The patient does not have concerns regarding medicines.  The following changes have been made:  no change  Labs/ tests ordered today: See above Assessment and Plan No orders of the defined types were placed in this encounter.     Disposition:   FU with me in 1 year  Signed, Quintella Reichert, MD  09/06/2015 1:36 PM    [facility] [address] [place], [place], [place]  [address] Phone: [phone]; Fax: [phone]

## 2015-09-06 NOTE — Patient Instructions (Signed)
Medication Instructions:  Your physician recommends that you continue on your current medications as directed. Please refer to the Current Medication list given to you today.   Labwork: None  Testing/Procedures: None  Follow-Up: Your physician wants you to follow-up in: 1 year with Dr. Mayford Knifeurner. You will receive a reminder letter in the mail two months in advance. If you don't receive a letter, please call our office to schedule the follow-up appointment.   Any Other Special Instructions Will Be Listed Below (If Applicable). If you have not heard from [facility] by the end of the week, please call [phone]. This is the direct line for our PAP assistant, Toma CopierBethany.    If you need a refill on your cardiac medications before your next appointment, please call your pharmacy.

## 2015-09-15 IMAGING — NM NM MYOCAR MULTI W/ SPECT
3 series · 18 of 18 positions shown · non-contrast
Comparison: none

[Series 1: stress_(id)_sa · 6.5mm · 6.51mm/px · 6 of 64 frames shown (1 of 2)]
[frame 6/64]
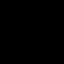
[frame 16/64]
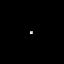
[frame 27/64]
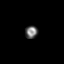
[frame 38/64]
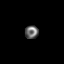
[frame 48/64]
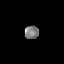
[frame 59/64]
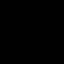

[Series 1: stress_(id)_sa · 6.5mm · 6.51mm/px · 6 of 512 frames shown (2 of 2)]
[frame 43/512]
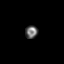
[frame 128/512]
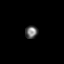
[frame 214/512]
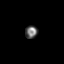
[frame 299/512]
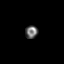
[frame 384/512]
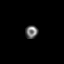
[frame 470/512]
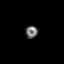

[Series 1: rest_(id)_sa · 6.5mm · 6.51mm/px · 6 of 64 frames shown]
[frame 6/64]
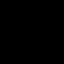
[frame 16/64]
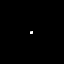
[frame 27/64]
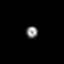
[frame 38/64]
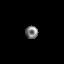
[frame 48/64]
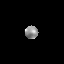
[frame 59/64]
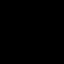

[18 of 18 positions shown; findings below may reference images not displayed]

Canned report from images found in remote index.

Refer to host system for actual result text.

## 2015-10-19 ENCOUNTER — Other Ambulatory Visit: Payer: Self-pay | Admitting: *Deleted

## 2015-10-19 DIAGNOSIS — I482 Chronic atrial fibrillation, unspecified: Secondary | ICD-10-CM

## 2015-11-02 LAB — CBC
HCT: 46.6 % (ref 39.0–52.0)
Hemoglobin: 16.1 g/dL (ref 13.0–17.0)
MCH: 33.8 pg (ref 26.0–34.0)
MCHC: 34.5 g/dL (ref 30.0–36.0)
MCV: 97.7 fL (ref 78.0–100.0)
MPV: 11 fL (ref 8.6–12.4)
PLATELETS: 211 10*3/uL (ref 150–400)
RBC: 4.77 MIL/uL (ref 4.22–5.81)
RDW: 14.4 % (ref 11.5–15.5)
WBC: 4.6 10*3/uL (ref 4.0–10.5)

## 2015-11-03 LAB — BASIC METABOLIC PANEL
BUN: 16 mg/dL (ref 7–25)
CALCIUM: 9.4 mg/dL (ref 8.6–10.3)
CO2: 27 mmol/L (ref 20–31)
Chloride: 107 mmol/L (ref 98–110)
Creat: 1.04 mg/dL (ref 0.70–1.25)
GLUCOSE: 99 mg/dL (ref 65–99)
Potassium: 4.1 mmol/L (ref 3.5–5.3)
Sodium: 142 mmol/L (ref 135–146)

## 2015-12-02 ENCOUNTER — Telehealth: Payer: Self-pay | Admitting: *Deleted

## 2015-12-02 NOTE — Telephone Encounter (Signed)
Hold apixaban 2 days prior to procedure and resume day after James Cobb  

## 2015-12-02 NOTE — Telephone Encounter (Signed)
Will fax this note to the number provided. 

## 2015-12-02 NOTE — Telephone Encounter (Signed)
Pt needs clearance for colonoscopy and the okay to hold eliquis 3 days prior to the procedure. Will forward for dr Jens Somcrenshaw review

## 2016-04-23 NOTE — Progress Notes (Signed)
HPI: FU atrial fibrillation. [facility]. He has a history of atrial flutter ablation in October of 2011. He had recurrent atrial fibrillation following the procedure. 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 He also has a history of PFO closure with Amplatzer device. Followup transesophageal echocardiogram showed no residual shunt. Patient seen by Dr. Johney FrameAllred and rate control/anticoagulation recommended. Rythmol discontinued. Patient had a sleep study that showed moderate obstructive sleep apnea. Holter monitor August 2016 showed atrial fibrillation with PVCs or aberrantly conducted beats, rate controlled. Nuclear study August 2016 showed ejection fraction 63% and normal perfusion. Echocardiogram August 2016 showed normal LV function, mild left ventricular hypertrophy, mild mitral regurgitation and no residual ASD/PFO. Since last seen the patient denies any dyspnea on exertion, orthopnea, PND, pedal edema, palpitations, syncope or chest pain.   Current Outpatient Prescriptions  Medication Sig Dispense Refill  . allopurinol (ZYLOPRIM) 100 MG tablet Take 2 tablets (200 mg total) by mouth daily.    Marland Kitchen. apixaban (ELIQUIS) 5 MG TABS tablet Take 1 tablet (5 mg total) by mouth 2 (two) times daily. 180 tablet 2  . atorvastatin (LIPITOR) 20 MG tablet TAKE 1 TABLET (20 MG TOTAL) BY MOUTH DAILY. 90 tablet 2  . fluticasone (FLONASE) 50 MCG/ACT nasal spray Place 2 sprays into both nostrils daily.    . folic acid (FOLVITE) 1 MG tablet Take 1 tablet (1 mg total) by mouth 2 (two) times daily. 180 tablet 2  . lisinopril (PRINIVIL,ZESTRIL) 20 MG tablet Take 1 tablet (20 mg total) by mouth daily. 90 tablet 2  . metoprolol succinate (TOPROL-XL) 50 MG 24 hr tablet Take 1 tablet (50 mg total) by mouth daily. 90 tablet 2  . polycarbophil (FIBERCON) 625 MG tablet Take 1,250 mg by mouth 2 (two) times daily.    . sildenafil (VIAGRA) 25 MG tablet Take 25 mg by  mouth daily as needed for erectile dysfunction.     No current facility-administered medications for this visit.      Past Medical History:  Diagnosis Date  . Angina pectoris (HCC)   . ASD (atrial septal defect)   . Atrial fibrillation (HCC)   . Bulging lumbar disc   . DJD (degenerative joint disease)    Cervical and lumbar with disc bulging on the cervical spine area with associated radiculopathy  . Dyspnea   . ED (erectile dysfunction)   . HTN (hypertension)   . Hyperlipidemia   . OSA (obstructive sleep apnea)    Moderate with AHI 16/hr and on CPAP at 16cm H2O with nasal mask  . Patent foramen ovale   . Radiculopathy   . TIA (transient ischemic attack)    s/p ASD Percutaneous Closure    Past Surgical History:  Procedure Laterality Date  . Arthroscopic knee surgery    . TONSILLECTOMY      Social History   Social History  . Marital status: Married    Spouse name: N/A  . Number of children: 2  . Years of education: N/A   Occupational History  .  [employer]    Commercial pilot   Social History Main Topics  . Smoking status: Never Smoker  . Smokeless tobacco: Never Used  . Alcohol use Yes     Comment: 2 glasses wine per night  . Drug use: Unknown  . Sexual activity: Not on file   Other Topics Concern  . Not on file   Social History Narrative  .  No narrative on file    Family History  Problem Relation Age of Onset  . Stroke Father   . Heart disease Father     Pacemaker, MI  . Diabetes Sister     ROS: no fevers or chills, productive cough, hemoptysis, dysphasia, odynophagia, melena, hematochezia, dysuria, hematuria, rash, seizure activity, orthopnea, PND, pedal edema, claudication. Remaining systems are negative.  Physical Exam: Well-developed well-nourished in no acute distress.  Skin is warm and dry.  HEENT is normal.  Neck is supple.  Chest is clear to auscultation with normal expansion.  Cardiovascular exam is irregular Abdominal exam nontender  or distended. No masses palpated. Extremities show no edema. neuro grossly intact  ECG Atrial fibrillation at a rate of 95. Low voltage.  A/P  1 Hypertension-blood pressure controlled. Continue present medications.  2 hyperlipidemia-continue statin.  3 atrial fibrillation-continue beta blocker for rate control. Continue apixaban.  4. Patent foramen ovale-status post closure.  5 obstructive sleep apnea-followed by Dr. Mayford Knife.   Olga Millers, MD

## 2016-04-25 ENCOUNTER — Ambulatory Visit (INDEPENDENT_AMBULATORY_CARE_PROVIDER_SITE_OTHER): Payer: Medicare Other | Admitting: Cardiology

## 2016-04-25 ENCOUNTER — Encounter: Payer: Self-pay | Admitting: Cardiology

## 2016-04-25 VITALS — BP 133/83 | HR 95 | Ht 68.0 in | Wt 214.4 lb

## 2016-04-25 DIAGNOSIS — I4891 Unspecified atrial fibrillation: Secondary | ICD-10-CM | POA: Diagnosis not present

## 2016-04-25 DIAGNOSIS — I482 Chronic atrial fibrillation, unspecified: Secondary | ICD-10-CM

## 2016-04-25 DIAGNOSIS — I10 Essential (primary) hypertension: Secondary | ICD-10-CM

## 2016-04-25 DIAGNOSIS — E785 Hyperlipidemia, unspecified: Secondary | ICD-10-CM | POA: Diagnosis not present

## 2016-04-25 NOTE — Patient Instructions (Signed)
Your physician recommends that you continue on your current medications as directed. Please refer to the Current Medication list given to you today.   Your physician wants you to follow-up in: YEAR WITH DR CRENSHAW  You will receive a reminder letter in the mail two months in advance. If you don't receive a letter, please call our office to schedule the follow-up appointment.  

## 2016-05-01 ENCOUNTER — Other Ambulatory Visit: Payer: Self-pay | Admitting: Cardiology

## 2016-05-01 DIAGNOSIS — I480 Paroxysmal atrial fibrillation: Secondary | ICD-10-CM

## 2016-10-17 ENCOUNTER — Telehealth: Payer: Self-pay | Admitting: Cardiology

## 2016-10-17 NOTE — Telephone Encounter (Signed)
New message       FYI Called patient to reschedule annual sleep appt from recall.  Pt states that he does not use his CPAP machine and did not think he needs to be seen.  He sees Dr Jens Somrenshaw and will follow up with him for regular cardiology needs.

## 2016-10-19 NOTE — Telephone Encounter (Signed)
I have sent Dr Mayford Knifeturner a message to see if she still wants to see him or not. I will contact him back with her response

## 2016-10-23 ENCOUNTER — Telehealth: Payer: Self-pay | Admitting: *Deleted

## 2016-10-23 NOTE — Telephone Encounter (Signed)
-----   Message from [name] Turner, MD sent at 10/21/2016  7:20 PM EST ----- Regarding: RE: 1 year appt He can discuss whether he wants to go back on CPAP with Dr. Crenshaw and if he does can set up OV with me  Traci ----- Message ----- From: Prescious Hurless G Karma Hiney, CMA Sent: 10/19/2016   4:56 PM To: [name] Turner, MD Subject: 1 year appt                                    Called patient to reschedule annual sleep appt from recall.  Pt states that he does not use his CPAP machine and did not think he needs to be seen.  He sees Dr Crenshaw and will follow up with him for regular cardiology needs. He says he is sleeping well and he feels fine and doesn't want to waste your time or his.  Do you still want to see him anyway? Thanks  

## 2016-10-23 NOTE — Telephone Encounter (Signed)
Called and left message to call back.

## 2016-10-26 ENCOUNTER — Telehealth: Payer: Self-pay | Admitting: *Deleted

## 2016-10-26 NOTE — Telephone Encounter (Signed)
Called the patient with the results, he verbalized understanding and agreed

## 2016-10-26 NOTE — Telephone Encounter (Signed)
-----   Message from Quintella Reichertraci R Turner, MD sent at 10/21/2016  7:20 PM EST ----- Regarding: RE: 1 year appt He can discuss whether he wants to go back on CPAP with Dr. Jens Somrenshaw and if he does can set up OV with me  Traci ----- Message ----- From: Reesa Cheworothea G Teiara Baria, CMA Sent: 10/19/2016   4:56 PM To: Quintella Reichertraci R Turner, MD Subject: 1 year appt                                    Called patient to reschedule annual sleep appt from recall.  Pt states that he does not use his CPAP machine and did not think he needs to be seen.  He sees Dr Jens Somrenshaw and will follow up with him for regular cardiology needs. He says he is sleeping well and he feels fine and doesn't want to waste your time or his.  Do you still want to see him anyway? Thanks

## 2017-04-15 ENCOUNTER — Other Ambulatory Visit: Payer: Self-pay | Admitting: Cardiology

## 2017-04-15 DIAGNOSIS — I480 Paroxysmal atrial fibrillation: Secondary | ICD-10-CM

## 2017-04-15 MED ORDER — APIXABAN 5 MG PO TABS: 5.0000 mg | ORAL_TABLET | Freq: Two times a day (BID) | ORAL | 0 refills | Status: DC

## 2017-04-15 MED ORDER — METOPROLOL SUCCINATE ER 50 MG PO TB24: 50.0000 mg | ORAL_TABLET | Freq: Every day | ORAL | 0 refills | Status: DC

## 2017-04-15 MED ORDER — FOLIC ACID 1 MG PO TABS: 1.0000 mg | ORAL_TABLET | Freq: Two times a day (BID) | ORAL | 0 refills | Status: DC

## 2017-04-15 MED ORDER — ATORVASTATIN CALCIUM 20 MG PO TABS: 20.0000 mg | ORAL_TABLET | Freq: Every day | ORAL | 0 refills | Status: DC

## 2017-04-15 MED ORDER — LISINOPRIL 20 MG PO TABS: 20.0000 mg | ORAL_TABLET | Freq: Every day | ORAL | 0 refills | Status: DC

## 2017-04-15 NOTE — Telephone Encounter (Signed)
Rx(s) sent to pharmacy electronically.  

## 2017-04-15 NOTE — Telephone Encounter (Signed)
New message     *STAT* If patient is at the pharmacy, call can be transferred to refill team.   1. Which medications need to be refilled? (please list name of each medication and dose if known)   ELIQUIS 5 MG TABS tablet TAKE 1 TABLET TWICE A DAY    folic acid (FOLVITE) 1 MG tablet TAKE 1 TABLET TWICE A DAY   lisinopril (PRINIVIL,ZESTRIL) 20 MG tablet TAKE 1 TABLET DAILY    atorvastatin (LIPITOR) 20 MG tablet TAKE 1 TABLET DAILY   TOPROL XL 50 MG 24 hr tablet TAKE 1 TABLET DAILY     2. Which pharmacy/location (including street and city if local pharmacy) is [facility] to [facility]   3. Do they need a 30 day or 90 day supply?  60 day until Oct 2 appt

## 2017-05-20 NOTE — Progress Notes (Signed)
HPI: FU atrial fibrillation. Previous care at [facility]. He has a history of atrial flutter ablation in October of 2011. He had recurrent atrial fibrillation following the procedure. 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 He also has a history of PFO closure with Amplatzer device. Followup transesophageal echocardiogram showed no residual shunt. Patient seen by Dr. Johney Frame and rate control/anticoagulation recommended. Rythmol discontinued. Patient had a sleep study that showed moderate obstructive sleep apnea. Holter monitor August 2016 showed atrial fibrillation with PVCs or aberrantly conducted beats, rate controlled. Nuclear study August 2016 showed ejection fraction 63% and normal perfusion. Echocardiogram August 2016 showed normal LV function, mild left ventricular hypertrophy, mild mitral regurgitation and no residual ASD/PFO. Since last seen the patient has dyspnea with more extreme activities but not with routine activities. It is relieved with rest. It is not associated with chest pain. There is no orthopnea, PND or pedal edema. There is no syncope or palpitations. There is no exertional chest pain.   Current Outpatient Prescriptions  Medication Sig Dispense Refill  . allopurinol (ZYLOPRIM) 100 MG tablet Take 2 tablets (200 mg total) by mouth daily.    Marland Kitchen apixaban (ELIQUIS) 5 MG TABS tablet Take 1 tablet (5 mg total) by mouth 2 (two) times daily. 180 tablet 0  . atorvastatin (LIPITOR) 20 MG tablet Take 1 tablet (20 mg total) by mouth daily. 90 tablet 0  . folic acid (FOLVITE) 1 MG tablet Take 1 tablet (1 mg total) by mouth 2 (two) times daily. 180 tablet 0  . lisinopril (PRINIVIL,ZESTRIL) 20 MG tablet Take 1 tablet (20 mg total) by mouth daily. 90 tablet 0  . metoprolol succinate (TOPROL XL) 50 MG 24 hr tablet Take 1 tablet (50 mg total) by mouth daily. Take with or immediately following a meal. 90 tablet 0  . sildenafil (VIAGRA) 25 MG  tablet Take 25 mg by mouth daily as needed for erectile dysfunction.     No current facility-administered medications for this visit.      Past Medical History:  Diagnosis Date  . Angina pectoris (HCC)   . ASD (atrial septal defect)   . Atrial fibrillation (HCC)   . Bulging lumbar disc   . DJD (degenerative joint disease)    Cervical and lumbar with disc bulging on the cervical spine area with associated radiculopathy  . Dyspnea   . ED (erectile dysfunction)   . HTN (hypertension)   . Hyperlipidemia   . OSA (obstructive sleep apnea)    Moderate with AHI 16/hr and on CPAP at 16cm H2O with nasal mask  . Patent foramen ovale   . Radiculopathy   . TIA (transient ischemic attack)    s/p ASD Percutaneous Closure    Past Surgical History:  Procedure Laterality Date  . Arthroscopic knee surgery    . TONSILLECTOMY      Social History   Social History  . Marital status: Married    Spouse name: N/A  . Number of children: 2  . Years of education: N/A   Occupational History  .  Net Solicitor   Social History Main Topics  . Smoking status: Never Smoker  . Smokeless tobacco: Never Used  . Alcohol use Yes     Comment: 2 glasses wine per night  . Drug use: Unknown  . Sexual activity: Not on file   Other Topics Concern  . Not on file   Social  History Narrative  . No narrative on file    Family History  Problem Relation Age of Onset  . Stroke Father   . Heart disease Father        Pacemaker, MI  . Diabetes Sister     ROS: no fevers or chills, productive cough, hemoptysis, dysphasia, odynophagia, melena, hematochezia, dysuria, hematuria, rash, seizure activity, orthopnea, PND, pedal edema, claudication. Remaining systems are negative.  Physical Exam: Well-developed well-nourished in no acute distress.  Skin is warm and dry.  HEENT is normal.  Neck is supple.  Chest is clear to auscultation with normal expansion.  Cardiovascular exam is  irregular Abdominal exam nontender or distended. No masses palpated. Extremities show no edema. neuro grossly intact  ECG- Atrial fibrillation at a rate of 89. Left axis deviation. Nonspecific ST changes. personally reviewed  A/P  1 Permanent atrial fibrillation-patient remains in atrial fibrillation today. Continue beta blocker for rate control. Continue anticoagulation with apixaban.   2 Hypertension-blood pressure is elevated. However typically controlled at home. Continue present medications and adjust as needed.  3 hyperlipidemia-continue statin.  4 history of patent foramen ovale-status post closure.  5 obstructive sleep apnea-patient is not using his CPAP. I discussed the importance of this. I will also arrange follow-up visit with Dr. Mayford Knife.  Olga Millers, MD

## 2017-05-29 ENCOUNTER — Ambulatory Visit (INDEPENDENT_AMBULATORY_CARE_PROVIDER_SITE_OTHER): Payer: Medicare Other | Admitting: Cardiology

## 2017-05-29 ENCOUNTER — Encounter: Payer: Self-pay | Admitting: Cardiology

## 2017-05-29 VITALS — BP 148/92 | HR 89 | Ht 68.0 in | Wt 212.0 lb

## 2017-05-29 DIAGNOSIS — G4733 Obstructive sleep apnea (adult) (pediatric): Secondary | ICD-10-CM | POA: Diagnosis not present

## 2017-05-29 DIAGNOSIS — E7800 Pure hypercholesterolemia, unspecified: Secondary | ICD-10-CM | POA: Diagnosis not present

## 2017-05-29 DIAGNOSIS — I10 Essential (primary) hypertension: Secondary | ICD-10-CM | POA: Diagnosis not present

## 2017-05-29 DIAGNOSIS — I482 Chronic atrial fibrillation: Secondary | ICD-10-CM

## 2017-05-29 DIAGNOSIS — I4821 Permanent atrial fibrillation: Secondary | ICD-10-CM

## 2017-05-29 NOTE — Patient Instructions (Signed)
Your physician wants you to follow-up in: ONE YEAR WITH DR CRENSHAW You will receive a reminder letter in the mail two months in advance. If you don't receive a letter, please call our office to schedule the follow-up appointment.   If you need a refill on your cardiac medications before your next appointment, please call your pharmacy.  

## 2017-06-02 ENCOUNTER — Emergency Department (HOSPITAL_BASED_OUTPATIENT_CLINIC_OR_DEPARTMENT_OTHER)
Admission: EM | Admit: 2017-06-02 | Discharge: 2017-06-02 | Disposition: A | Discharge status: Home / Self Care | Payer: Medicare Other | Attending: Emergency Medicine | Admitting: Emergency Medicine

## 2017-06-02 ENCOUNTER — Encounter (HOSPITAL_BASED_OUTPATIENT_CLINIC_OR_DEPARTMENT_OTHER): Payer: Self-pay | Admitting: Emergency Medicine

## 2017-06-02 DIAGNOSIS — H9202 Otalgia, left ear: Secondary | ICD-10-CM | POA: Diagnosis present

## 2017-06-02 DIAGNOSIS — I4891 Unspecified atrial fibrillation: Secondary | ICD-10-CM | POA: Diagnosis not present

## 2017-06-02 DIAGNOSIS — Z7901 Long term (current) use of anticoagulants: Secondary | ICD-10-CM | POA: Diagnosis not present

## 2017-06-02 DIAGNOSIS — Z79899 Other long term (current) drug therapy: Secondary | ICD-10-CM | POA: Diagnosis not present

## 2017-06-02 DIAGNOSIS — Z8673 Personal history of transient ischemic attack (TIA), and cerebral infarction without residual deficits: Secondary | ICD-10-CM | POA: Insufficient documentation

## 2017-06-02 DIAGNOSIS — H60502 Unspecified acute noninfective otitis externa, left ear: Secondary | ICD-10-CM | POA: Insufficient documentation

## 2017-06-02 DIAGNOSIS — I10 Essential (primary) hypertension: Secondary | ICD-10-CM | POA: Insufficient documentation

## 2017-06-02 MED ORDER — CIPROFLOXACIN HCL 500 MG PO TABS: 500.0000 mg | ORAL_TABLET | Freq: Two times a day (BID) | ORAL | 0 refills | Status: AC

## 2017-06-02 MED ORDER — ACETIC ACID 2 % OT SOLN: 4.0000 [drp] | Freq: Three times a day (TID) | OTIC | 0 refills | Status: AC

## 2017-06-02 MED ORDER — NEOMYCIN-POLYMYXIN-HC 3.5-10000-1 OT SUSP: 4.0000 [drp] | Freq: Three times a day (TID) | OTIC | 0 refills | Status: AC

## 2017-06-02 NOTE — ED Provider Notes (Signed)
[facility] Provider Note   CSN: [id number] Arrival date & time: 06/02/17  1359     History   Chief Complaint Chief Complaint  Patient presents with  . Otalgia    HPI James Cobb is a 71 y.o. male presenting with left ear pain 4 days.  Patient states that for the past 4 days, he has had worsening pain and swelling of his left ear. He has associated decreased hearing loss, as he states it feels muffled. 4 days ago, he felt like he got water in his ear during his shower, which never improved. He denies pain in the other ear. He denies frequent history of otitis externa. He denies fevers, chills, sore throat, or neck pain. He soaked ear wicks of hydrogen peroxide, which did not improve his symptoms. Nothing has made it better, touching his ear makes it worse. He denies drainage for discharge from his ear.   HPI  Past Medical History:  Diagnosis Date  . Angina pectoris (HCC)   . ASD (atrial septal defect)   . Atrial fibrillation (HCC)   . Bulging lumbar disc   . DJD (degenerative joint disease)    Cervical and lumbar with disc bulging on the cervical spine area with associated radiculopathy  . Dyspnea   . ED (erectile dysfunction)   . HTN (hypertension)   . Hyperlipidemia   . OSA (obstructive sleep apnea)    Moderate with AHI 16/hr and on CPAP at 16cm H2O with nasal mask  . Patent foramen ovale   . Radiculopathy   . TIA (transient ischemic attack)    s/p ASD Percutaneous Closure    Patient Active Problem List   Diagnosis Date Noted  . OSA (obstructive sleep apnea)   . Obesity (BMI 30-39.9) 02/02/2015  . Snoring 07/19/2014  . Dyslipidemia   . Atrial fibrillation (HCC)   . Patent foramen ovale   . TIA (transient ischemic attack)   . Dyspnea   . Angina pectoris (HCC)   . HTN (hypertension)     Past Surgical History:  Procedure Laterality Date  . Arthroscopic knee surgery    . TONSILLECTOMY         Home Medications    Prior to Admission  medications   Medication Sig Start Date End Date Taking? Authorizing Provider  acetic acid (VOSOL) 2 % otic solution Place 4 drops into the left ear 3 (three) times daily. 06/02/17 06/07/17  Tomasina Keasling, PA-C  allopurinol (ZYLOPRIM) 100 MG tablet Take 2 tablets (200 mg total) by mouth daily. 10/22/14   Lewayne Bunting, MD  apixaban (ELIQUIS) 5 MG TABS tablet Take 1 tablet (5 mg total) by mouth 2 (two) times daily. 04/15/17   Lewayne Bunting, MD  atorvastatin (LIPITOR) 20 MG tablet Take 1 tablet (20 mg total) by mouth daily. 04/15/17   Lewayne Bunting, MD  ciprofloxacin (CIPRO) 500 MG tablet Take 1 tablet (500 mg total) by mouth 2 (two) times daily. 06/02/17 06/07/17  Vertis Scheib, PA-C  folic acid (FOLVITE) 1 MG tablet Take 1 tablet (1 mg total) by mouth 2 (two) times daily. 04/15/17   Lewayne Bunting, MD  lisinopril (PRINIVIL,ZESTRIL) 20 MG tablet Take 1 tablet (20 mg total) by mouth daily. 04/15/17   Lewayne Bunting, MD  metoprolol succinate (TOPROL XL) 50 MG 24 hr tablet Take 1 tablet (50 mg total) by mouth daily. Take with or immediately following a meal. 04/15/17   Crenshaw, Madolyn Frieze, MD  neomycin-polymyxin-hydrocortisone (CORTISPORIN) 3.5-10000-1 OTIC  suspension Place 4 drops into the left ear 3 (three) times daily. 06/02/17 06/07/17  Yasmine Kilbourne, PA-C  sildenafil (VIAGRA) 25 MG tablet Take 25 mg by mouth daily as needed for erectile dysfunction.    [provider]    Family History Family History  Problem Relation Age of Onset  . Stroke Father   . Heart disease Father        Pacemaker, MI  . Diabetes Sister     Social History Social History  Substance Use Topics  . Smoking status: Never Smoker  . Smokeless tobacco: Never Used  . Alcohol use Yes     Comment: 2 glasses wine per night     Allergies   Patient has no known allergies.   Review of Systems Review of Systems  Constitutional: Negative for chills and fever.  HENT: Positive for ear pain.  Negative for congestion and sore throat.      Physical Exam Updated Vital Signs BP (!) 157/99 (BP Location: Left Arm)   Pulse 89   Temp 98.2 F (36.8 C) (Oral)   Resp 18   Ht  (1.727 m)   Wt 96.2 kg (212 lb)   SpO2 100%   BMI 32.23 kg/m   Physical Exam  Constitutional: He is oriented to person, place, and time. He appears well-developed and well-nourished. No distress.  HENT:  Head: Normocephalic and atraumatic.  Right Ear: Tympanic membrane, external ear and ear canal normal.  Nose: Nose normal.  Mouth/Throat: Uvula is midline, oropharynx is clear and moist and mucous membranes are normal.  Patient with erythema and swelling of the left ear canal. TM cannot be visualized due to swelling. Right ear canal and TM normal.  Eyes: EOM are normal.  Neck: Normal range of motion.  Cardiovascular: Normal rate, regular rhythm and intact distal pulses.   Pulmonary/Chest: Effort normal and breath sounds normal. No respiratory distress. He has no wheezes. He has no rales.  Abdominal: He exhibits no distension.  Musculoskeletal: Normal range of motion.  Neurological: He is alert and oriented to person, place, and time.  Skin: Skin is warm. No rash noted.  Psychiatric: He has a normal mood and affect.  Nursing note and vitals reviewed.    ED Treatments / Results  Labs (all labs ordered are listed, but only abnormal results are displayed) Labs Reviewed - No data to display  EKG  EKG Interpretation None       Radiology No results found.  Procedures Procedures (including critical care time)  Medications Ordered in ED Medications - No data to display   Initial Impression / Assessment and Plan / ED Course  I have reviewed the triage vital signs and the nursing notes.  Pertinent labs & imaging results that were available during my care of the patient were reviewed by me and considered in my medical decision making (see chart for details).     Patient presenting with  4 day history of left ear pain. Physical exam shows erythematous and swollen left ear canal. Patient is afebrile. Case discussed with attending, Dr. Denton Lank evaluated the patient. Will place patient on ear drops and give oral antibiotics, as there is concern as to whether the ear drops will reach the infection. Discussed with patient. Patient to follow-up with primary care doctor if symptoms are not improving. At this time, patient appears safe for discharge. Return precautions given. Patient states he understands and agrees to plan.   Final Clinical Impressions(s) / ED Diagnoses  Final diagnoses:  Acute otitis externa of left ear, unspecified type    New Prescriptions Discharge Medication List as of 06/02/2017  5:04 PM    START taking these medications   Details  acetic acid (VOSOL) 2 % otic solution Place 4 drops into the left ear 3 (three) times daily., Starting Sun 06/02/2017, Until Fri 06/07/2017, Print    ciprofloxacin (CIPRO) 500 MG tablet Take 1 tablet (500 mg total) by mouth 2 (two) times daily., Starting Sun 06/02/2017, Until Fri 06/07/2017, Print    neomycin-polymyxin-hydrocortisone (CORTISPORIN) 3.5-10000-1 OTIC suspension Place 4 drops into the left ear 3 (three) times daily., Starting Sun 06/02/2017, Until Fri 06/07/2017, Print         Chaim Gatley, PA-C 06/03/17 4098    Cathren Laine, MD 06/03/17 1251

## 2017-06-02 NOTE — ED Triage Notes (Signed)
L ear pain x 2 days, redness and swelling noted to external ear.

## 2017-06-02 NOTE — Discharge Instructions (Signed)
Using ear drops 3 times daily for 5 days. Take antibiotics twice a day for 5 days. Follow-up with your primary care doctor if symptoms are not improving. Try to avoid situations in which water will get into your ears. Do not use Q-tips, as this can worsen symptoms. Return to emergency room if you develop fevers, chills, worsening pain, or any new or worsening symptoms.

## 2017-06-20 ENCOUNTER — Ambulatory Visit: Payer: Medicare Other | Admitting: Cardiology

## 2017-06-28 ENCOUNTER — Encounter: Payer: Self-pay | Admitting: *Deleted

## 2017-07-05 ENCOUNTER — Other Ambulatory Visit: Payer: Self-pay | Admitting: Cardiology

## 2017-07-10 ENCOUNTER — Telehealth: Payer: Self-pay | Admitting: Cardiology

## 2017-07-10 DIAGNOSIS — Z79899 Other long term (current) drug therapy: Secondary | ICD-10-CM

## 2017-07-10 MED ORDER — LISINOPRIL 40 MG PO TABS: 40.0000 mg | ORAL_TABLET | Freq: Every day | ORAL | 1 refills | Status: DC

## 2017-07-10 NOTE — Telephone Encounter (Signed)
Patient aware of MD recommendations. Voiced understanding. [place] [facility] [place]. Rx(s) sent to pharmacy electronically - aware he can take 2 - 20mg  lisinopril tablets until new Rx comes in from [facility]

## 2017-07-10 NOTE — Telephone Encounter (Signed)
New message     Patient calling back about a medication increase, which medication was he suppose to change, his pcp wants him to increase the metoprolol succinate (TOPROL XL) 50 MG 24 hr tablet

## 2017-07-10 NOTE — Telephone Encounter (Signed)
Patient saw his PCP today and he recommended that he increase metoprolol succinate 50mg  daily to 100mg  daily. He reports his BP was elevated some at his PCP office today - 140/96. Home readings have been a little lower than this - 130s / high 80s. He has resumed his CPAP usage and has used 3 weeks pretty consistently.   Per Dr. Jens Somrenshaw last note on 10/3: Hypertension-blood pressure is elevated. However typically controlled at home. Continue present medications and adjust as needed.  Patient would prefer Dr. Jens Somrenshaw advise on his BP med changes. Will route to MD/RN

## 2017-07-10 NOTE — Telephone Encounter (Signed)
Increase lisinopril to 40 mg daily; bmet one week; follow BP Olga MillersBrian Crenshaw

## 2017-07-12 ENCOUNTER — Ambulatory Visit: Payer: Medicare Other | Admitting: Cardiology

## 2017-07-14 ENCOUNTER — Other Ambulatory Visit: Payer: Self-pay | Admitting: Cardiology

## 2017-07-14 DIAGNOSIS — I480 Paroxysmal atrial fibrillation: Secondary | ICD-10-CM

## 2017-07-17 ENCOUNTER — Telehealth: Payer: Self-pay | Admitting: *Deleted

## 2017-07-17 ENCOUNTER — Ambulatory Visit (INDEPENDENT_AMBULATORY_CARE_PROVIDER_SITE_OTHER): Payer: Medicare Other | Admitting: Cardiology

## 2017-07-17 ENCOUNTER — Encounter: Payer: Self-pay | Admitting: Cardiology

## 2017-07-17 VITALS — BP 140/90 | HR 110 | Ht 68.0 in | Wt 220.2 lb

## 2017-07-17 DIAGNOSIS — E669 Obesity, unspecified: Secondary | ICD-10-CM

## 2017-07-17 DIAGNOSIS — I10 Essential (primary) hypertension: Secondary | ICD-10-CM | POA: Diagnosis not present

## 2017-07-17 DIAGNOSIS — G4733 Obstructive sleep apnea (adult) (pediatric): Secondary | ICD-10-CM | POA: Diagnosis not present

## 2017-07-17 NOTE — Telephone Encounter (Signed)
-----   Message from LazearDonnisha Robertson, [place]RN sent at 07/17/2017  2:29 PM EST ----- Regarding: DME order  DME order placed  Thanks  Rena

## 2017-07-17 NOTE — Telephone Encounter (Signed)
Order sent to [facility] via community message 

## 2017-07-17 NOTE — Progress Notes (Signed)
Cardiology Office Note:    Date:  07/17/2017   ID:  James Cobb, DOB 09/11/1945, MRN [id number]  PCP:  Andreas Blowerabeza, Yuri M., MD  Cardiologist:  Armanda Magicraci Artesia Berkey, MD   Referring MD: Andreas Blowerabeza, Yuri M., MD   Chief Complaint  Patient presents with  . Sleep Apnea  . Hypertension    History of Present Illness:    James CrossRene Andreason is a 71 y.o. male with a hx of atrial fibrillation/flutter, moderate OSA with AHI 16/hr on CPAP at 16cm H2o and HTN.  He apparently stopped using his CPAP about 10 months ago after retiring and then saw Dr. Jens Somrenshaw a month ago and his BP was elevated so he went back on the CPAP device.  He is doing well with his CPAP device. He tolerates the nasal mask and feels the pressure is adequate.  Since going back on CPAP he feels rested in the am and has no significant daytime sleepiness.  He denies any significant mouth or nasal dryness.  He does not think that he snores.     Past Medical History:  Diagnosis Date  . Angina pectoris (HCC)   . ASD (atrial septal defect)   . Atrial fibrillation (HCC)   . Bulging lumbar disc   . DJD (degenerative joint disease)    Cervical and lumbar with disc bulging on the cervical spine area with associated radiculopathy  . Dyspnea   . ED (erectile dysfunction)   . HTN (hypertension)   . Hyperlipidemia   . OSA (obstructive sleep apnea)    Moderate with AHI 16/hr and on CPAP at 16cm H2O with nasal mask  . Patent foramen ovale   . Radiculopathy   . TIA (transient ischemic attack)    s/p ASD Percutaneous Closure    Past Surgical History:  Procedure Laterality Date  . Arthroscopic knee surgery    . TONSILLECTOMY      Current Medications: Current Meds  Medication Sig  . allopurinol (ZYLOPRIM) 100 MG tablet Take 2 tablets (200 mg total) by mouth daily.  Marland Kitchen. atorvastatin (LIPITOR) 20 MG tablet TAKE 1 TABLET DAILY  . ELIQUIS 5 MG TABS tablet TAKE 1 TABLET TWICE A DAY  . folic acid (FOLVITE) 1 MG tablet TAKE 1 TABLET TWICE A DAY  .  lisinopril (PRINIVIL,ZESTRIL) 40 MG tablet Take 1 tablet (40 mg total) daily by mouth.  . metoprolol succinate (TOPROL-XL) 50 MG 24 hr tablet TAKE 1 TABLET DAILY WITH OR IMMEDIATELY FOLLOWING A MEAL  . sildenafil (VIAGRA) 25 MG tablet Take 25 mg by mouth daily as needed for erectile dysfunction.     Allergies:   Patient has no known allergies.   Social History   Socioeconomic History  . Marital status: Married    Spouse name: None  . Number of children: 2  . Years of education: None  . Highest education level: None  Social Needs  . Financial resource strain: None  . Food insecurity - worry: None  . Food insecurity - inability: None  . Transportation needs - medical: None  . 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 Smoking status: Never Smoker  . Smokeless tobacco: Never Used  Substance and Sexual Activity  . Alcohol use: Yes    Comment: 2 glasses wine per night  . Drug use: None  . Sexual activity: None  Other Topics Concern  . None  Social History Narrative  .  None     Family History: The patient's family history includes Diabetes in his sister; Heart disease in his father; Stroke in his father.  ROS:   Please see the history of present illness.    ROS  All other systems reviewed and negative.   EKGs/Labs/Other Studies Reviewed:    The following studies were reviewed today: CPAP download  EKG:  EKG is not ordered today.    Recent Labs: No results found for requested labs within last 8760 hours.   Recent Lipid Panel    Component Value Date/Time   CHOL 117 (L) 04/26/2015 1144   TRIG 76 04/26/2015 1144   HDL 36 (L) 04/26/2015 1144   CHOLHDL 3.3 04/26/2015 1144   VLDL 15 04/26/2015 1144   LDLCALC 66 04/26/2015 1144    Physical Exam:    VS:  Pulse (!) 110   Ht 5\' 8"  (1.727 m)   Wt 220 lb 3.2 oz (99.9 kg)   SpO2 96%   BMI 33.48 kg/m     Wt Readings from Last  3 Encounters:  07/17/17 220 lb 3.2 oz (99.9 kg)  06/02/17 212 lb (96.2 kg)  05/29/17 212 lb (96.2 kg)     GEN:  Well nourished, well developed in no acute distress HEENT: Normal NECK: No JVD; No carotid bruits LYMPHATICS: No lymphadenopathy CARDIAC: irregularly irregular, no murmurs, rubs, gallops RESPIRATORY:  Clear to auscultation without rales, wheezing or rhonchi  ABDOMEN: Soft, non-tender, non-distended MUSCULOSKELETAL:  No edema; No deformity  SKIN: Warm and dry NEUROLOGIC:  Alert and oriented x 3 PSYCHIATRIC:  Normal affect   ASSESSMENT:    1. OSA (obstructive sleep apnea)   2. Essential hypertension   3. Obesity (BMI 30-39.9)    PLAN:    In order of problems listed above:  1.  OSA - the patient is tolerating PAP therapy well without any problems. The PAP download was reviewed today and showed an AHI of 4.4/hr on 10 cm H2O with 57% compliance in using more than 4 hours nightly.  The patient has been using and benefiting from CPAP use and will continue to benefit from therapy. He has just restarted his CPAP in the past 4 weeks so that is why his compliance is down.  I encouraged him to continue with the CPAP nightly for at least 6 hours nightly.    2.  HTN - BP is well controlled on exam today.  He will continue on Toprol XL 50mg  daily and Lisinopril 40mg  daily.   3.  Obesity - I have encouraged him to get into a routine exercise program and cut back on carbs and portions.    Medication Adjustments/Labs and Tests Ordered: Current medicines are reviewed at length with the patient today.  Concerns regarding medicines are outlined above.  No orders of the defined types were placed in this encounter.  No orders of the defined types were placed in this encounter.   Signed, Armanda Magicraci Amory Zbikowski, MD  07/17/2017 2:07 PM    [facility]

## 2017-07-17 NOTE — Patient Instructions (Signed)
Medication Instructions:  Your physician recommends that you continue on your current medications as directed. Please refer to the Current Medication list given to you today.  Labwork: None ordered   Testing/Procedures: None ordered   Follow-Up: Your physician wants you to follow-up in: 1 year with Dr. Mayford Knifeurner. You will receive a reminder letter in the mail two months in advance. If you don't receive a letter, please call our office to schedule the follow-up appointment.  Any Other Special Instructions Will Be Listed Below (If Applicable).  CPAP mask has been ordered    If you need a refill on your cardiac medications before your next appointment, please call your pharmacy.

## 2017-07-23 ENCOUNTER — Telehealth: Payer: Self-pay | Admitting: *Deleted

## 2017-07-23 DIAGNOSIS — I480 Paroxysmal atrial fibrillation: Secondary | ICD-10-CM

## 2017-07-23 NOTE — Telephone Encounter (Signed)
Received paperwork from the patient regarding bp readings from 07-10-17 to 07-14-17. Left message for pt to call to find out how his bp has been doing since the increase in lisinopril.

## 2017-07-23 NOTE — Telephone Encounter (Signed)
Left message for pt, per dr Jens Somcrenshaw patient to increase metoprolol to 75 mg once daily. Patient ask to call me back tomorrow.

## 2017-07-23 NOTE — Telephone Encounter (Signed)
Spoke with pt, his bp when he saw dr turner his bp was 140/90 and pulse 110. Since the 21st he is consistently getting 140's/high 80's with pulse in the 90's. He goes to the gym 3 to 4 days a week and is not able to loose any weight, he feels his weight gain maybe a side effect of some of his medications, he denies edema. He has also restarted his CPAP for the last 3 weeks and feels better since he has been using it. Will discuss with dr Jens Somcrenshaw

## 2017-07-24 NOTE — Telephone Encounter (Signed)
Spoke with pt, his bp today was 136/82. He does not want to increase the metoprolol at this time. He feels he is having some of the symptoms related to metoprolol. Okay given for him to continue to monitor his bp and call if increases.

## 2017-07-24 NOTE — Telephone Encounter (Signed)
Follow Up ° ° ° °Pt returning call from yesterday. Please call. °

## 2017-09-18 ENCOUNTER — Telehealth: Payer: Self-pay | Admitting: *Deleted

## 2017-09-18 ENCOUNTER — Telehealth: Payer: Self-pay | Admitting: Cardiology

## 2017-09-18 NOTE — Telephone Encounter (Signed)
Patient called today to say he never received his mask and hose  that was ordered for him back in November. Cpap assistant called and spoke with the re- supply team and learned the patient needed a needed medical necessity form . Form received, will get it signed and faxed back.

## 2017-09-18 NOTE — Telephone Encounter (Signed)
New message  Pt verbalized that he is calling for the RN  For his mast and hose for his sleep pap machine

## 2017-09-19 NOTE — Telephone Encounter (Signed)
Reached out to medical records and [facility].

## 2017-10-03 ENCOUNTER — Other Ambulatory Visit: Payer: Self-pay | Admitting: Cardiology

## 2017-10-03 NOTE — Telephone Encounter (Signed)
REFILL 

## 2017-10-12 ENCOUNTER — Other Ambulatory Visit: Payer: Self-pay | Admitting: Cardiology

## 2017-10-12 DIAGNOSIS — I480 Paroxysmal atrial fibrillation: Secondary | ICD-10-CM

## 2017-10-14 ENCOUNTER — Other Ambulatory Visit: Payer: Self-pay

## 2017-10-14 MED ORDER — LISINOPRIL 40 MG PO TABS: 40.0000 mg | ORAL_TABLET | Freq: Every day | ORAL | 3 refills | Status: DC

## 2017-10-14 NOTE — Telephone Encounter (Signed)
REFILL 

## 2017-10-17 ENCOUNTER — Other Ambulatory Visit: Payer: Self-pay | Admitting: *Deleted

## 2017-10-17 MED ORDER — LISINOPRIL 40 MG PO TABS: 40.0000 mg | ORAL_TABLET | Freq: Every day | ORAL | 3 refills | Status: AC

## 2018-04-13 ENCOUNTER — Other Ambulatory Visit: Payer: Self-pay | Admitting: Cardiology

## 2018-04-15 NOTE — Telephone Encounter (Signed)
Waiting for PCP office to send recent blood work request (per patient request)

## 2018-04-29 ENCOUNTER — Telehealth: Payer: Self-pay | Admitting: Cardiology

## 2018-04-29 NOTE — Telephone Encounter (Signed)
New Message:  Patient calling he would like for some one to call and get his labs his Dr. Dennis Bast.

## 2018-04-30 NOTE — Telephone Encounter (Signed)
Spoke with dr Einar Grad office, labs were faxed to the [place] office. Request[facility].

## 2018-05-07 MED ORDER — APIXABAN 5 MG PO TABS: 5.0000 mg | ORAL_TABLET | Freq: Two times a day (BID) | ORAL | 0 refills | Status: DC

## 2018-05-07 NOTE — Telephone Encounter (Signed)
Patient called for refill on Eliquis.  Patient had sent lab work on PCP office.  Not scanned in system yet.  I will give 90-day refill until office visit in clinic.  He is due for his yearly follow-up.

## 2018-05-09 ENCOUNTER — Other Ambulatory Visit: Payer: Self-pay

## 2018-05-09 ENCOUNTER — Telehealth: Payer: Self-pay | Admitting: *Deleted

## 2018-05-09 MED ORDER — APIXABAN 5 MG PO TABS: 5.0000 mg | ORAL_TABLET | Freq: Two times a day (BID) | ORAL | 0 refills | Status: AC

## 2018-05-09 NOTE — Telephone Encounter (Signed)
[place] and needs a referral from Dr. Sonny Dandyrsnshaw for a new cardiologist.

## 2018-05-19 ENCOUNTER — Telehealth: Payer: Self-pay | Admitting: Cardiology

## 2018-05-19 DIAGNOSIS — I480 Paroxysmal atrial fibrillation: Secondary | ICD-10-CM

## 2018-05-19 NOTE — Telephone Encounter (Signed)
Left message for the patient, we do not know anyone in [place]. Ask the patient to call me with the name and contact information of who he would like to see and we will be happy to send them a referral.

## 2018-05-19 NOTE — Telephone Encounter (Signed)
New Message        Patient called with the information [facility]Anthony  Alfieri phone #[phone] Fax # [phone]

## 2018-05-19 NOTE — Telephone Encounter (Signed)
See phone note 05/09/18

## 2018-05-20 NOTE — Telephone Encounter (Signed)
See other telephone note.  

## 2018-05-20 NOTE — Telephone Encounter (Signed)
Spoke with pt, aware referral order placed.
